# Patient Record
Sex: Female | Born: 1945 | Race: White | Hispanic: No | Marital: Single
Health system: Midwestern US, Community
[De-identification: ages and names within clinical notes are randomized; demographics above are authoritative.]

---

## 2013-12-09 IMAGING — MG MAMMOGRAPHY SCREENING BILATERAL 3D TOMOSYNTHESIS WITH CAD
12 of 16 series · 12 of 32 positions shown · non-contrast
Comparison: 05/11/2011 through 07/18/2007.
BREAST DENSITY: (Level B) There are scattered areas of fibroglandular density.

MAMMOGRAPHY SCREENING 3D TOMOSYNTHESIS WITH CAD, 12/09/2013 [DATE]:
HISTORY: Screening.
TECHNIQUE: Digital mammograms and 3-D Tomosynthesis were obtained. These were
interpreted both primarily and with the aid of computer-aided detection
system.

[R MLO]
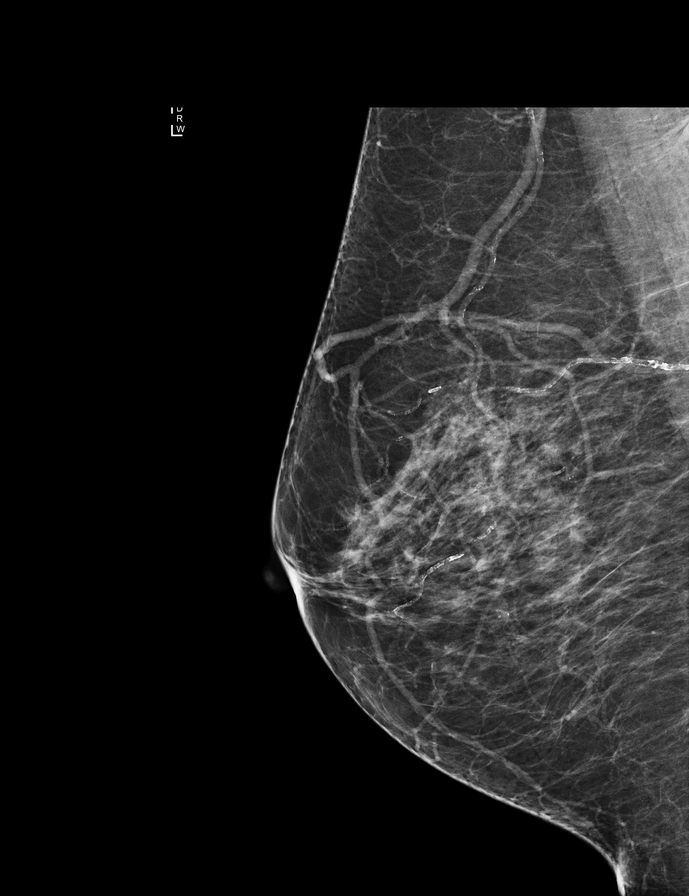

[L MLO]
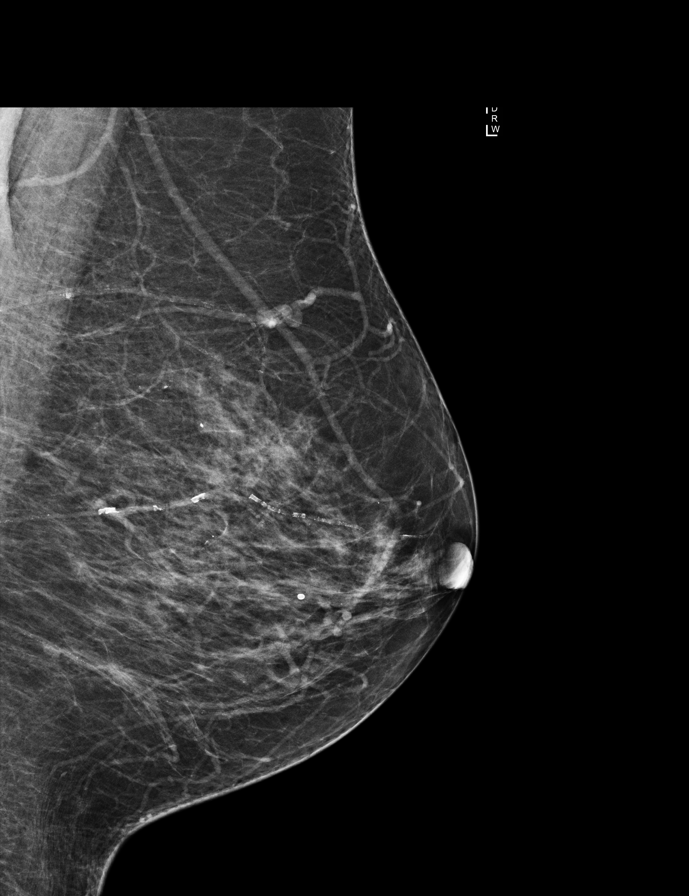

[R MLO synth-2D]
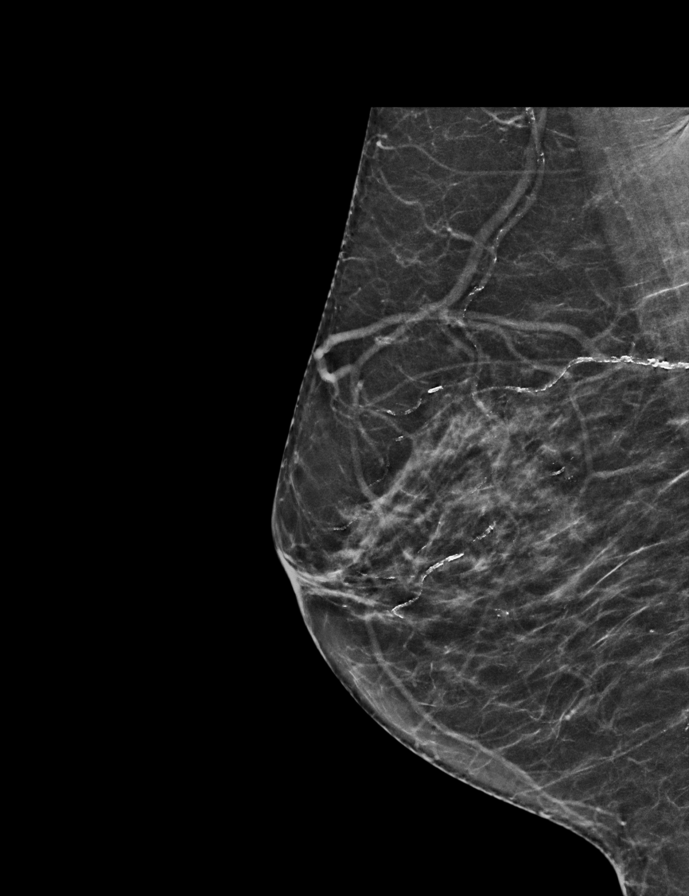

[L CC synth-2D]
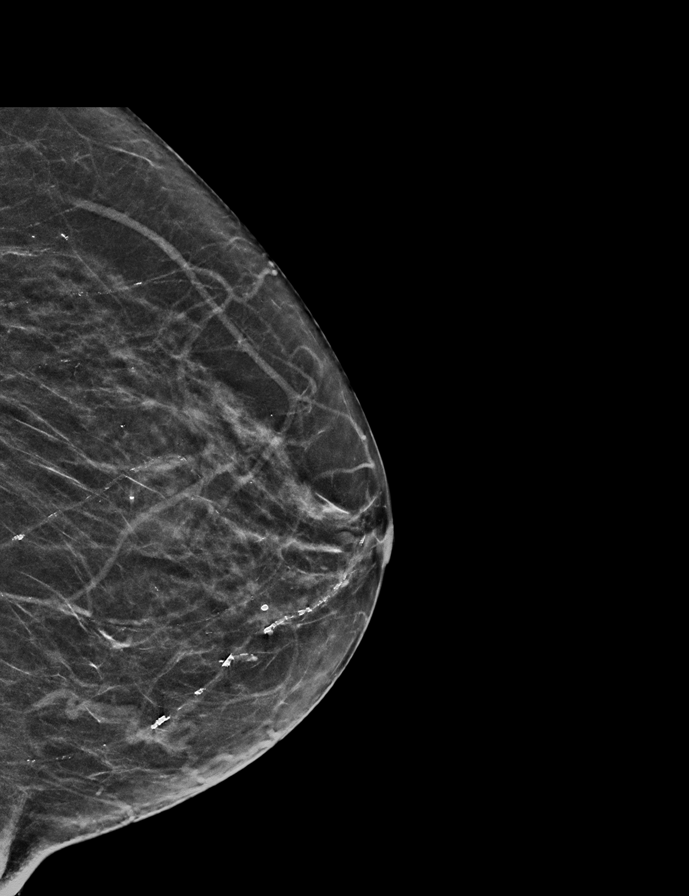

[L CC]
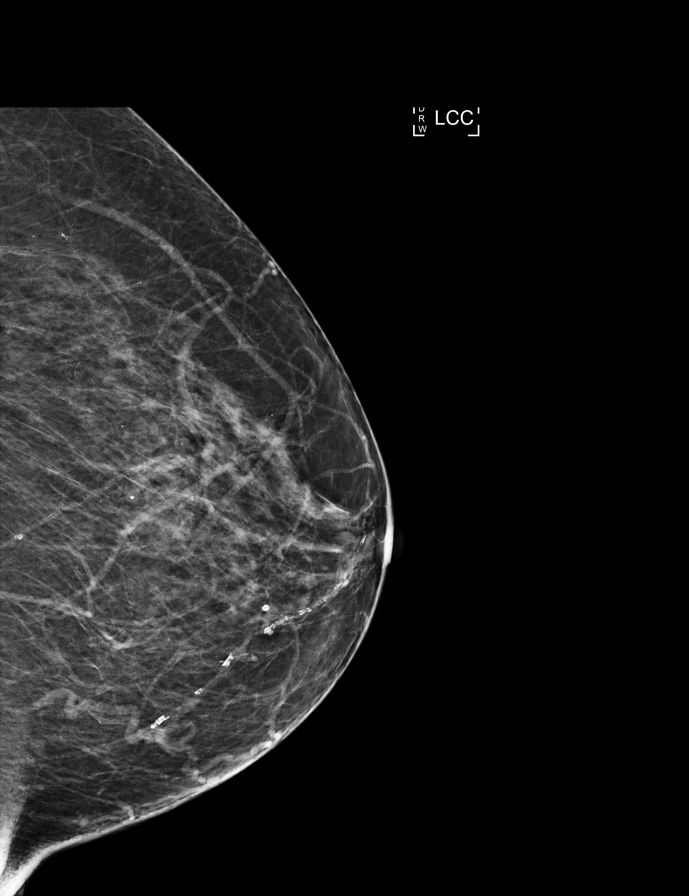

[R CC]
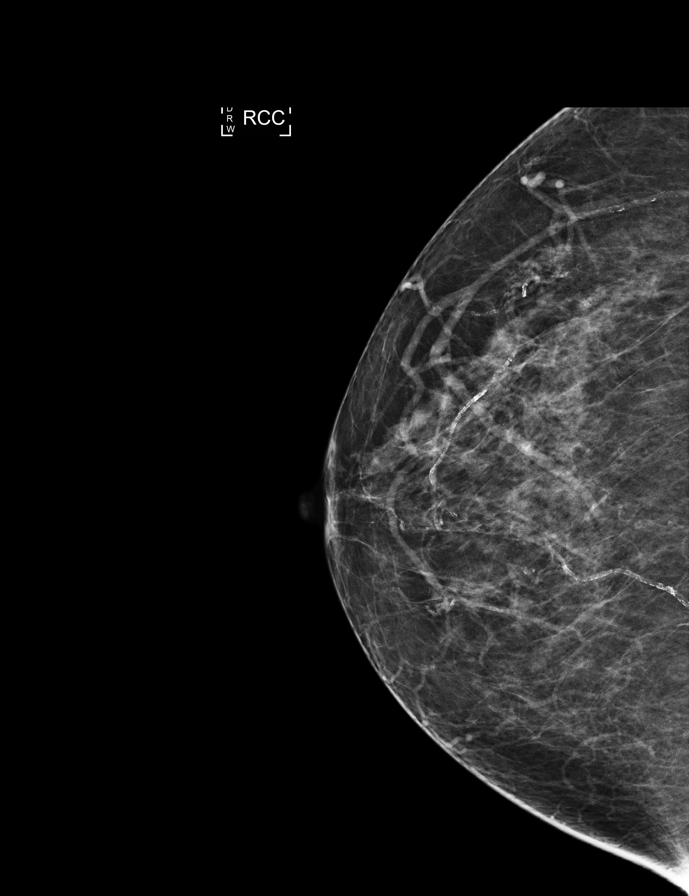

[L MLO synth-2D]
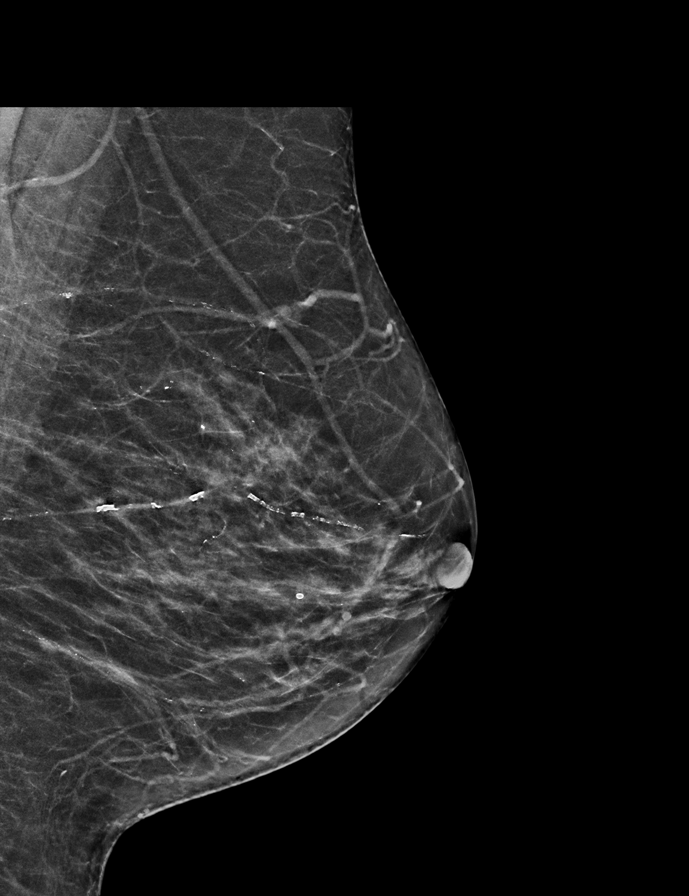

[R CC synth-2D]
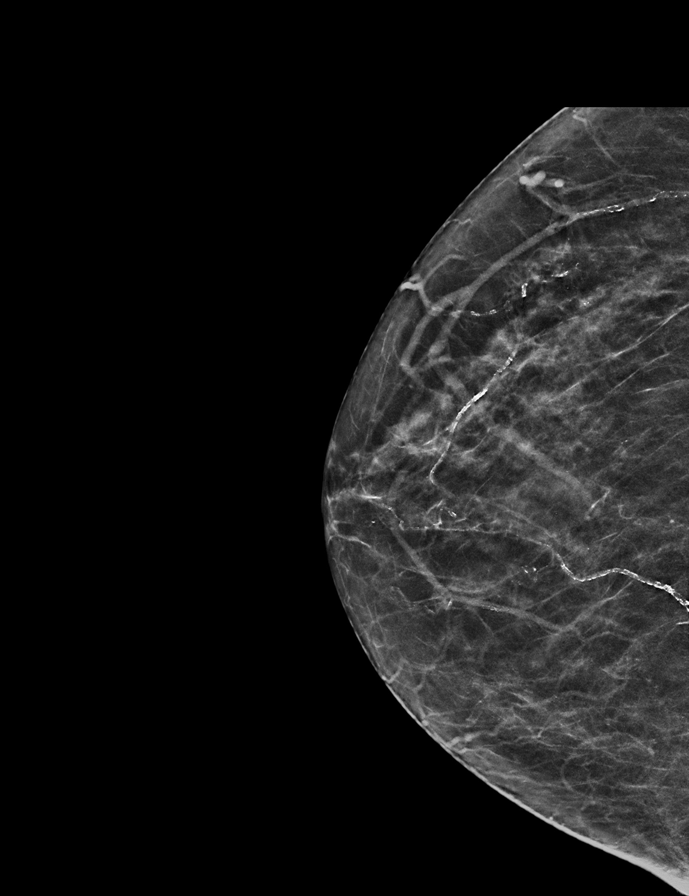

[L CC tomo]
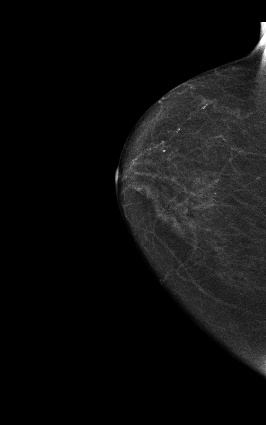

[R CC tomo]
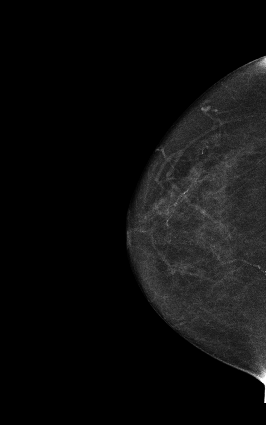

[L MLO tomo]
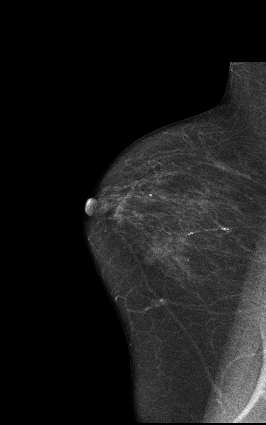

[R MLO tomo]
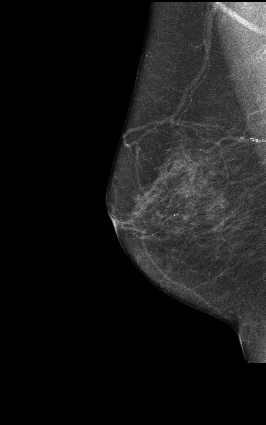

[12 of 32 positions shown; findings below may reference images not displayed]

FINDINGS: No mammographically suspicious abnormality and no significant
change.
IMPRESSION: ( BI-RADS 2) Benign findings. Routine mammographic follow-up is recommended.

## 2016-08-22 ENCOUNTER — Inpatient Hospital Stay
Admit: 2016-08-22 | Discharge: 2016-08-22 | Disposition: A | Discharge status: Home / Self Care | Payer: MEDICARE | Attending: Emergency Medicine

## 2016-08-22 ENCOUNTER — Emergency Department: Admit: 2016-08-22 | Payer: MEDICARE

## 2016-08-22 DIAGNOSIS — D1800 Hemangioma unspecified site: Secondary | ICD-10-CM

## 2016-08-22 LAB — CBC WITH AUTO DIFFERENTIAL
Absolute Eos #: 0.18 10*3/uL (ref 0.0–0.4)
Absolute Lymph #: 2.64 10*3/uL (ref 1.0–4.8)
Absolute Mono #: 0.35 10*3/uL (ref 0.1–0.8)
Basophils Absolute: 0 10*3/uL (ref 0.0–0.2)
Basophils: 0 % (ref 0–2)
Eosinophils %: 2 % (ref 1–4)
Hematocrit: 37.5 % (ref 36–46)
Hemoglobin: 12.1 g/dL (ref 12.0–16.0)
Lymphocytes: 30 % (ref 24–44)
MCH: 30.5 pg (ref 26–34)
MCHC: 32.2 g/dL (ref 31–37)
MCV: 94.8 fL (ref 80–100)
MPV: 7.4 fL (ref 6.0–12.0)
Metamyelocytes Absolute: 0.26 10*3/uL — ABNORMAL HIGH
Metamyelocytes: 3 % — ABNORMAL HIGH
Monocytes: 4 % (ref 1–7)
Myelocytes Absolute: 0.09 10*3/uL — ABNORMAL HIGH
Myelocytes: 1 % — ABNORMAL HIGH
Platelets: 461 10*3/uL — ABNORMAL HIGH (ref 140–450)
RBC: 3.96 m/uL — ABNORMAL LOW (ref 4.0–5.2)
RDW: 14.6 % (ref 12.5–15.4)
Seg Neutrophils: 60 % (ref 36–66)
Segs Absolute: 5.28 10*3/uL (ref 1.8–7.7)
WBC: 8.8 10*3/uL (ref 3.5–11.0)

## 2016-08-22 LAB — BASIC METABOLIC PANEL
Anion Gap: 12 mmol/L (ref 9–17)
BUN: 10 mg/dL (ref 8–23)
CO2: 28 mmol/L (ref 20–31)
Calcium: 9.4 mg/dL (ref 8.6–10.4)
Chloride: 98 mmol/L (ref 98–107)
Creatinine: 0.7 mg/dL (ref 0.50–0.90)
GFR African American: >60 mL/min (ref >60)
GFR Non-African American: >60 mL/min (ref >60)
Glucose: 121 mg/dL — ABNORMAL HIGH (ref 70–99)
Potassium: 4.8 mmol/L (ref 3.7–5.3)
Sodium: 138 mmol/L (ref 135–144)

## 2016-08-22 MED ORDER — NORMAL SALINE FLUSH 0.9 % IV SOLN
0.9 % | INTRAVENOUS | Status: DC | PRN
Start: 2016-08-22 — End: 2016-08-22
  Administered 2016-08-22: 20:00:00 10 mL via INTRAVENOUS

## 2016-08-22 MED ORDER — SODIUM CHLORIDE 0.9 % IV BOLUS
0.9 % | Freq: Once | INTRAVENOUS | Status: AC
Start: 2016-08-22 — End: 2016-08-22
  Administered 2016-08-22: 20:00:00 70 mL via INTRAVENOUS

## 2016-08-22 MED ORDER — IOPAMIDOL 76 % IV SOLN
76 % | Freq: Once | INTRAVENOUS | Status: AC | PRN
Start: 2016-08-22 — End: 2016-08-22
  Administered 2016-08-22: 20:00:00 70 mL via INTRAVENOUS

## 2016-08-22 NOTE — Progress Notes (Signed)
Linda Tate called PCP. Patient was directed to follow up with PCP.  Patient understood.

## 2016-08-22 NOTE — ED Notes (Addendum)
Dr. Lovena Le paged oncology on call at [facility] awaiting call back       Fonnie Birkenhead, RN  08/22/16 [place], RN  08/22/16 [phone]

## 2016-08-22 NOTE — ED Notes (Signed)
Called [facility] access center called for update. They have paged twice and awaiting a call back.     Gertie Fey, RN  08/22/16 1758

## 2016-08-22 NOTE — ED Notes (Signed)
Dr. Lovena Le spoke with oncology on call for [facility] and they suggest patient f/u w/neurosurgery. Dr. Lovena Le to bedside to update patient and her family.      Fonnie Birkenhead, RN  08/22/16 [phone]

## 2016-08-22 NOTE — ED Provider Notes (Signed)
[facility] ED  [place] [address]  Phone: [phone]      Pt Name: Linda Tate  MRN: [id number]  [place] 1946/04/07  Date of evaluation: 08/22/2016      CHIEF COMPLAINT       Chief Complaint   Patient presents with   ??? Facial Injury     [place] today at 1015 when she tripped over a rug and hit the corner of a wall in her home on the left side of her face. Denies LOC, vision changes, nausea, vomiting, or dizziness. Not taking any blood thinners. Took two 81 mg aspirin for pain after falling. Swelling, bruising to left eyelid, abrasion to left eyebrow area. Abrasions to left cheek.    ??? Neck Pain     C/o anterior and posterior neck pain s/p fall.    ??? Fall   ??? Knee Pain     right knee pain s/p fall       HISTORY OF PRESENT ILLNESS    71 year old female presents to the emergency department today after she sustained a mechanical fall.  She struck the left side of her face as well as the left side of her neck.  She also does complain of right knee pain from the fall.  She denies any loss of conscious.  She denies any nausea vomiting.  No otorrhea or rhinorrhea.  She is complaining of facial pain where she struck the wall as well as neck pain.  She denies any distal deficits.  Nothing makes it better and nothing makes it worse.  Pain on scale of 0-10 is a 6.  There's been no other contact [place] evaluation or management of these symptoms right arrival.    REVIEW OF SYSTEMS     Review of Systems   All other systems reviewed and are negative.        PAST MEDICAL HISTORY    has a past medical history of Anxiety; Arthritis; Depression; Hypertension; Right wrist fracture; and Thyroid disease.    SURGICAL HISTORY      has a past surgical history that includes Appendectomy; partial hysterectomy (cervix not removed); Facial reconstruction surgery; Toe Surgery (Right); Carpal tunnel release; Wrist fracture surgery (Right); joint replacement; Total knee arthroplasty (Right); and shoulder surgery  (Right).    CURRENT MEDICATIONS       Previous Medications    ALPRAZOLAM (XANAX) 1 MG TABLET    Take 1 mg by mouth 3 times daily.    ARIPIPRAZOLE (ABILIFY) 5 MG TABLET    Take 2.5 mg by mouth daily    CALCIUM-MAGNESIUM-VITAMIN D (CALCIUM 1200+D3 PO)    Take by mouth    CELECOXIB (CELEBREX) 200 MG CAPSULE    Take 200 mg by mouth daily    CYCLOBENZAPRINE (FLEXERIL) 5 MG TABLET    Take 5 mg by mouth daily as needed for Muscle spasms    CYCLOSPORINE (RESTASIS) 0.05 % OPHTHALMIC EMULSION    1 drop 2 times daily    ETANERCEPT (ENBREL) 50 MG/ML INJECTION    Inject 25 mg into the skin once a week    FOLIC ACID (FOLVITE) [id number] MCG TABLET    Take 1,200 mcg by mouth daily    HYDROCODONE-ACETAMINOPHEN (NORCO) 10-325 MG PER TABLET    Take 1 tablet by mouth every 6 hours as needed for Pain.    LEVOTHYROXINE (SYNTHROID) 88 MCG TABLET    Take 88 mcg by mouth Daily    LOSARTAN (COZAAR) 50 MG TABLET  Take 50 mg by mouth daily    MAGNESIUM 500 MG TABS    Take 1 tablet by mouth daily    METHOTREXATE (RHEUMATREX) 2.5 MG CHEMO TABLET    Take 20 mg by mouth once a week    MULTIPLE VITAMINS-MINERALS (MULTIVITAMIN ADULT PO)    Take by mouth    OMEPRAZOLE (PRILOSEC) 40 MG DELAYED RELEASE CAPSULE    Take 40 mg by mouth daily    SPECIALTY VITAMINS PRODUCTS (ONE-A-DAY BONE STRENGTH PO)    Take by mouth    TRAZODONE (DESYREL) 50 MG TABLET    Take 50 mg by mouth nightly    VITAMIN C (ASCORBIC ACID) 500 MG TABLET    Take 500 mg by mouth daily       ALLERGIES     is allergic to nickel.    FAMILY HISTORY     has no family status information on file.      family history is not on file.    SOCIAL HISTORY      reports that she has never smoked. She has never used smokeless tobacco. She reports that she does not drink alcohol or use drugs.    PHYSICAL EXAM     INITIAL VITALS:  height is 5\' 1"  (1.549 m) and weight is 68.9 kg (152 lb). Her oral temperature is 97.5 ??F (36.4 ??C). Her blood pressure is 160/90 (abnormal) and her pulse is 121. Her respiration  is 16 and oxygen saturation is 95%.   Physical Exam   Constitutional: She is oriented to person, place, and time. She appears well-developed and well-nourished. No distress.   HENT:   Head: Normocephalic.   Mouth/Throat: Oropharynx is clear and moist.   Atraumatic except for ecchymosis lateral to this patient's left orbit.  No midface instability.  No bony crepitus.  No subcutaneous crepitus.   Eyes: Conjunctivae and EOM are normal. Pupils are equal, round, and reactive to light.   Neck: No tracheal deviation present.   Patient has some posterior neck tenderness to palpation.  No paraspinal tenderness to palpation.  No midline crepitus or step-offs.  Patient has been placed in cervical collar.  There is a mass in this patient's left anterior neck.  The submandibular area.  It is nontender and freely movable.  It is quite large.   Cardiovascular: Normal rate, regular rhythm and intact distal pulses.    Pulmonary/Chest: Effort normal and breath sounds normal. No respiratory distress.   Abdominal: Soft. Bowel sounds are normal. She exhibits no distension. There is no tenderness.   Musculoskeletal: Normal range of motion. She exhibits no edema or tenderness.   Neurological: She is alert and oriented to person, place, and time. No cranial nerve deficit. She exhibits normal muscle tone.   Skin: Skin is warm and dry.   Psychiatric: She has a normal mood and affect. Her behavior is normal. Judgment and thought content normal.   Vitals reviewed.        DIFFERENTIAL DIAGNOSIS/ MDM:   I've ordered a CAT scan of her head and cervical spine.  Of ordered an x-ray of her right knee to address the issue of trauma.    DIAGNOSTIC RESULTS     EKG:  None    RADIOLOGY:   Xr Knee Right (3 Views)    Result Date: 08/22/2016  EXAMINATION: 3 VIEWS OF THE RIGHT KNEE 08/22/2016 1:47 pm COMPARISON: None. HISTORY: ORDERING SYSTEM PROVIDED HISTORY: knee pain TECHNOLOGIST PROVIDED HISTORY: Reason for exam:->knee pain Ordering Physician Provided  Reason for Exam: s/p fall, knee pain Acuity: Acute Type of Exam: Initial FINDINGS: There is a well-seated right knee arthroplasty.  No periprosthetic lucency is identified.  There is no suprapatellar joint effusion.     Right knee arthroplasty appears well seated.  No fracture or dislocation is identified.     Ct Head Wo Contrast    Result Date: 08/22/2016  EXAMINATION: CT OF THE HEAD WITHOUT CONTRAST  08/22/2016 2:07 pm TECHNIQUE: CT of the head was performed without the administration of intravenous contrast. Dose modulation, iterative reconstruction, and/or weight based adjustment of the mA/kV was utilized to reduce the radiation dose to as low as reasonably achievable. COMPARISON: None HISTORY: ORDERING SYSTEM PROVIDED HISTORY: HEAD TRAUMA, CLOSED, MILD, GCS >= 13, NO RISK FACTORS, NEURO EXAM NORMAL TECHNOLOGIST PROVIDED HISTORY: Has a "code stroke" or "stroke alert" been called?->No Ordering Physician Provided Reason for Exam: S/p fall into wall, hit left side of face Acuity: Acute Type of Exam: Initial FINDINGS: BRAIN/VENTRICLES: Patchy deep white matter low-attenuation changes predominately in the occipital lobes are demonstrated, these are statistically consistent with small vessel disease for patient's age; however, possibility of posterior reversible encephalopathy syndrome "PRES" cannot be entirely excluded.  There is no acute intracranial hemorrhage, mass effect or midline shift.  No abnormal extra-axial fluid collection.  The gray-white differentiation is maintained without evidence of an acute infarct.  There is no evidence of hydrocephalus. ORBITS: The visualized portion of the orbits demonstrate no acute abnormality. SINUSES: The visualized paranasal sinuses and mastoid air cells demonstrate no acute abnormality. SOFT TISSUES/SKULL:  No acute abnormality of the visualized skull or soft tissues.  Mandible and adjacent osseous structures cannot be evaluated due to artifacts emanating from dental  hardware.     Patchy deep white matter low-attenuation changes predominately in the occipital lobe likely a sequela of small vessel disease; however, less likely possibility of posterior reversible encephalopathy syndrome 'PRES' cannot be entirely excluded. No evidence of acute intracranial injury.     Ct Soft Tissue Neck W Contrast    Result Date: 08/22/2016  EXAMINATION: CT OF THE NECK SOFT TISSUE WITH CONTRAST  08/22/2016 TECHNIQUE: CT of the neck was performed with the administration of intravenous contrast. Multiplanar reformatted images are provided for review. Dose modulation, iterative reconstruction, and/or weight based adjustment of the mA/kV was utilized to reduce the radiation dose to as low as reasonably achievable. COMPARISON: None. HISTORY: ORDERING SYSTEM PROVIDED HISTORY: MASS OR LUMP, NECK TECHNOLOGIST PROVIDED HISTORY: Ordering Physician Provided Reason for Exam: S/P fall into wall, CT cspine suggests CT Soft Tissue suspects mass vs hematoma.BB on area of interest Acuity: Acute Type of Exam: Initial FINDINGS: PHARYNX/LARYNX:  Nasopharynx is normal.  Uvula is not well seen.  Epiglottis is normal.  Piriform sinuses are normal.  Detail of the vocal cords is limited. SALIVARY GLANDS/THYROID:  No parotid, thyroid or submandibular gland lesion is noted. LYMPH NODES:  Small cervical lymph nodes are present bilaterally.  The largest is in the lower cervical region on the left measuring approximately 1.4 x 0.9 cm. SOFT TISSUES:  There is a large enhancing mass lesion, likely enhancing mass lesion, centered at the level of the left carotid artery bifurcation.  This measures approximately 3.8 x 2.2 x 3 cm.  There is mass-effect on the jugular vein and carotid arteries.  There are numerous adjacent vessels in the soft tissues.  Small collateral vessels extend up to the jugular foramen as well. BRAIN/ORBITS/SINUSES:  The visualized portion of the intracranial contents appear  unremarkable.  The visualized portion  of the orbits, paranasal sinuses and mastoid air cells demonstrate no acute abnormality. LUNG APICES/SUPERIOR MEDIASTINUM:  0 enlarged lymph nodes in the upper mediastinum are noted.  No apical lung nodules are noted.  Apical pleural thickening is noted bilaterally. BONES:  No aggressive lytic bone lesions are noted.     There is a large enhancing mass lesion centered at the level of the carotid artery bifurcation.  This is predominantly along the posterior aspect of the bifurcation with mass-effect/displacement of the carotid arteries anteriorly and posterior displacement and compression of the jugular vein.  There are numerous adjacent vessels in the soft tissues raising the question of collateral vessels or arteriovenous shunting.  There is extension of these vessels up to the jugular foramen at the skullbase level.  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  The tumor does not appear to significantly surround the carotid artery.  There may be some extension between the carotid arteries.  There are numerous adjacent collateral vessels perhaps related to early arteriovenous shunting.  A less likely consideration for this tumor would be malignant schwannoma.     Ct Cervical Spine Wo Contrast    Result Date: 08/22/2016  EXAMINATION: CT OF THE CERVICAL SPINE WITHOUT CONTRAST 08/22/2016 2:09 pm TECHNIQUE: CT of the cervical spine was performed without the administration of intravenous contrast. Multiplanar reformatted images are provided for review. Dose modulation, iterative reconstruction, and/or weight based adjustment of the mA/kV was utilized to reduce the radiation dose to as low as reasonably achievable. COMPARISON: None. HISTORY: ORDERING SYSTEM PROVIDED HISTORY: NECK PAIN FOLLOWING TRAUMA TECHNOLOGIST PROVIDED HISTORY: Ordering Physician Provided Reason for Exam: S/p fall into wall, hit left side of face Acuity: Acute Type of Exam: Initial FINDINGS: BONES/ALIGNMENT: There is loss of the  normal cervical spine lordosis.  Facets overlap bilaterally at every level.  No acute fracture in the cervical spine is noted. DEGENERATIVE CHANGES: Degenerative changes throughout the cervical spine are noted. SOFT TISSUES: Prevertebral soft tissue thickness is normal. Incidentally, there is a large amount of asymmetric soft tissue along the deep aspect of the left sternocleidomastoid muscle.  This is seen best on axial images 29 through 42.  CT neck with contrast is recommended to exclude enlarged lymph node or focal mass lesion.  Another consideration is hematoma. There is also high density in the 4th ventricle.  Hounsfield units suggest that this is calcification.  Unusual calcification of the choroid plexus is favored over a calcified lesion.     No acute abnormality of the cervical spine. There is a suspected soft tissue mass or markedly enlarged lymph node in the left side of the neck.  A CT neck with contrast is recommended.  Another consideration, given the history of trauma, is hematoma.  However, There is no high density to suggest hemorrhage.         LABS:  Results for orders placed or performed during the hospital encounter of 08/22/16   CBC Auto Differential   Result Value Ref Range    WBC 8.8 3.5 - 11.0 k/uL    RBC 3.96 (L) 4.0 - 5.2 m/uL    Hemoglobin 12.1 12.0 - 16.0 g/dL    Hematocrit 37.5 36 - 46 %    MCV 94.8 80 - 100 fL    MCH 30.5 26 - 34 pg    MCHC 32.2 31 - 37 g/dL    RDW 14.6 12.5 - 15.4 %    Platelets  461 (H) 140 - 450 k/uL    MPV 7.4 6.0 - 12.0 fL    NRBC Automated NOT REPORTED per 100 WBC    Differential Type NOT REPORTED     Immature Granulocytes NOT REPORTED 0 %    Absolute Immature Granulocyte NOT REPORTED 0.00 - 0.30 k/uL    WBC Morphology NOT REPORTED     RBC Morphology NOT REPORTED     Platelet Estimate NOT REPORTED     Seg Neutrophils 60 36 - 66 %    Lymphocytes 30 24 - 44 %    Monocytes 4 1 - 7 %    Eosinophils % 2 1 - 4 %    Basophils 0 0 - 2 %    Metamyelocytes 3 (H) 0 %     Myelocytes 1 (H) 0 %    Segs Absolute 5.28 1.8 - 7.7 k/uL    Absolute Lymph # 2.64 1.0 - 4.8 k/uL    Absolute Mono # 0.35 0.1 - 0.8 k/uL    Absolute Eos # 0.18 0.0 - 0.4 k/uL    Basophils # 0.00 0.0 - 0.2 k/uL    Metamyelocytes Absolute 0.26 (H) 0 k/uL    Myelocytes Absolute 0.09 (H) 0 k/uL    Morphology LARGE PLATELETS PRESENT    Basic Metabolic Panel   Result Value Ref Range    Glucose 121 (H) 70 - 99 mg/dL    BUN 10 8 - 23 mg/dL    CREATININE 0.70 0.50 - 0.90 mg/dL    Bun/Cre Ratio NOT REPORTED 9 - 20    Calcium 9.4 8.6 - 10.4 mg/dL    Sodium 138 135 - 144 mmol/L    Potassium 4.8 3.7 - 5.3 mmol/L    Chloride 98 98 - 107 mmol/L    CO2 28 20 - 31 mmol/L    Anion Gap 12 9 - 17 mmol/L    GFR Non-African American >60 >60 mL/min    GFR African American >60 >60 mL/min    GFR Comment          GFR Staging NOT REPORTED        EMERGENCY DEPARTMENT COURSE:     The patient was given the following medications:  Orders Placed This Encounter   Medications   ??? iopamidol (ISOVUE-370) 76 % injection 70 mL   ??? 0.9 % sodium chloride bolus   ??? sodium chloride flush 0.9 % injection 10 mL        Vitals:    Vitals:    08/22/16 1337   BP: (!) 160/90   Pulse: 121   Resp: 16   Temp: 97.5 ??F (36.4 ??C)   TempSrc: Oral   SpO2: 95%   Weight: 68.9 kg (152 lb)   Height: 5\' 1"  (1.549 m)     -------------------------  BP: (!) 160/90, Temp: 97.5 ??F (36.4 ??C), Pulse: 121, Resp: 16      Re-evaluation Notes  With the initial CT result, I have ordered a CT with IV contrast to address the radiologist's concern.    I reviewed the results with the patient.  She tells me that this mass has been there for at least a couple years.  I do feel that she should certainly have close follow-up with the specialist.    I have discussed with the oncology the appropriate disposition for this patient.  He recommends neurosurgery.  I've written for consultation.  Patient will be discharged.    CONSULTS:  None  CRITICAL CARE:   None    PROCEDURES:  None    FINAL  IMPRESSION      1. Glomus tumor    2. Closed head injury, initial encounter    3. Acute strain of neck muscle, initial encounter    4. Acute pain of right knee    5. Facial contusion, initial encounter          DISPOSITION/PLAN   DISPOSITION Decision To Discharge 08/22/2016 06:02:30 PM      Condition on Disposition  Good    PATIENT REFERRED TO:  Annett Fabian, MD  [place] [address]  [phone]    Schedule an appointment as soon as possible for a visit in 2 days      Rhodia Albright, [place]  [place] [address] [place] [address]  [phone]    Schedule an appointment as soon as possible for a visit in 2 days        DISCHARGE MEDICATIONS:  New Prescriptions    No medications on file       (Please note that portions of this note were completed with a voice recognition program.  Efforts were made to edit the dictations but occasionally words are mis-transcribed.)    Manuela Neptune MD, F.A.C.E.P, F.A.A.E.M  Emergency Physician Attending         Manuela Neptune, MD  08/22/16 [phone]

## 2016-08-22 NOTE — Progress Notes (Signed)
PCP notified. Sending ED notes.

## 2016-08-28 NOTE — Telephone Encounter (Signed)
Patient called the clinic asking for the phone number for the [facility].  She was referred to Neurosurgery but Dr. Jola Babinski declined the referral.  SEE NOTE    "Message   Received: 6 days ago   Message Contents   Zubair [place], DO  [facility]; Harland German; Charlayne McClendon-Williams, MA      ??      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 I do not do these operations so no point in setting up the appt.      Harland German spoke with this patient's PCP. ED notes were sent her PCP.

## 2020-02-16 IMAGING — DX HAND 3 VIEWS LEFT
3 series · 3 of 3 positions shown · non-contrast
Comparison: None.

HAND 3 VIEWS RIGHT, HAND 3 VIEWS LEFT, 02/16/2020 [DATE]: 
CLINICAL INDICATION: History of rheumatoid arthritis. Hand pain.

[PA]
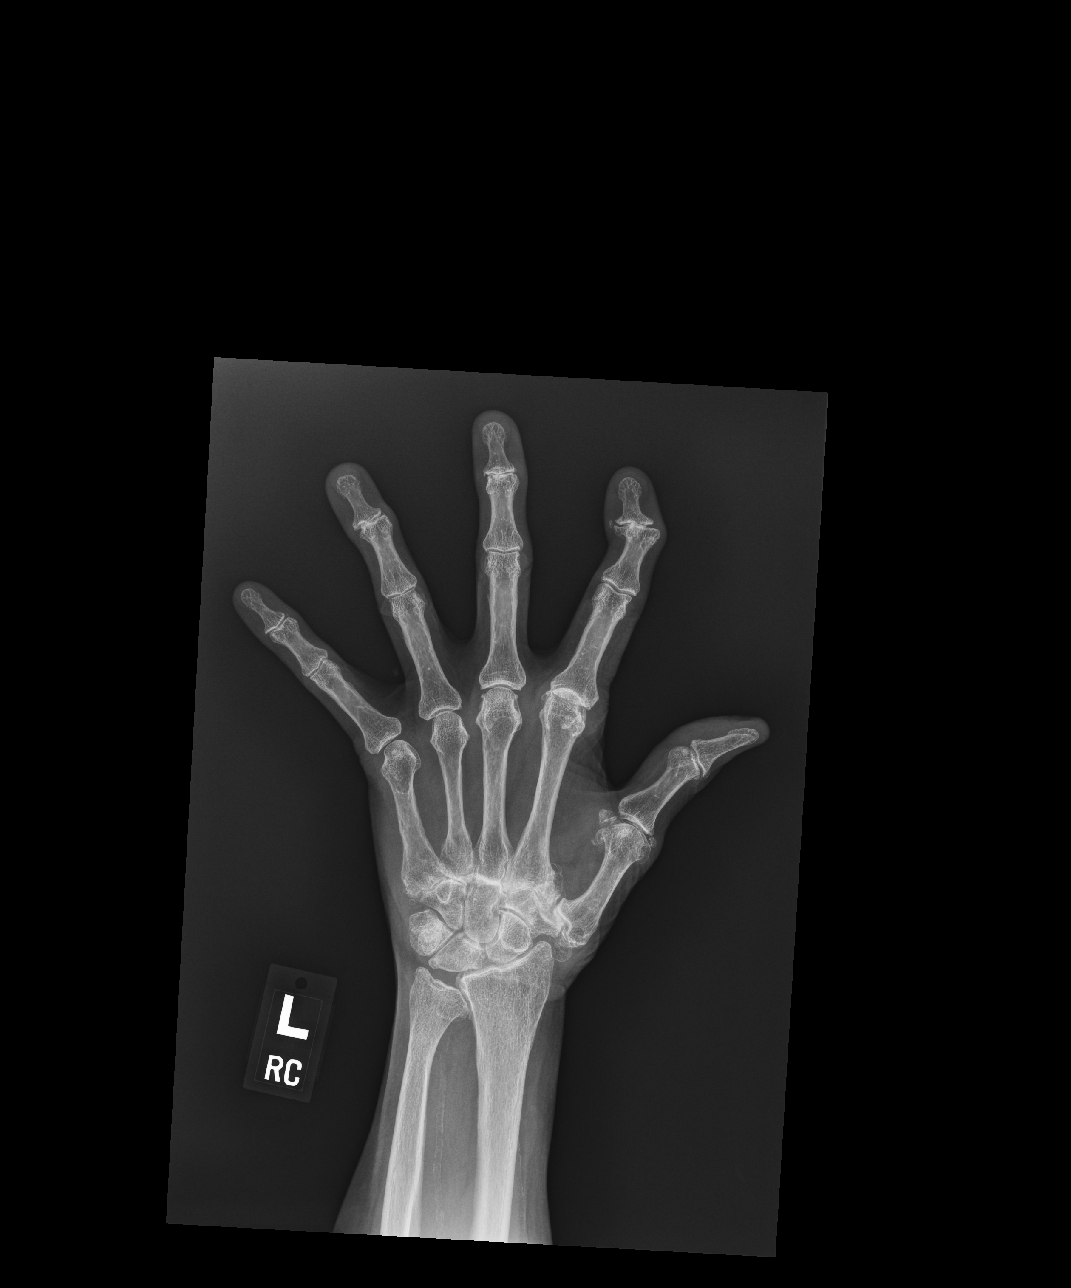

[oblique]
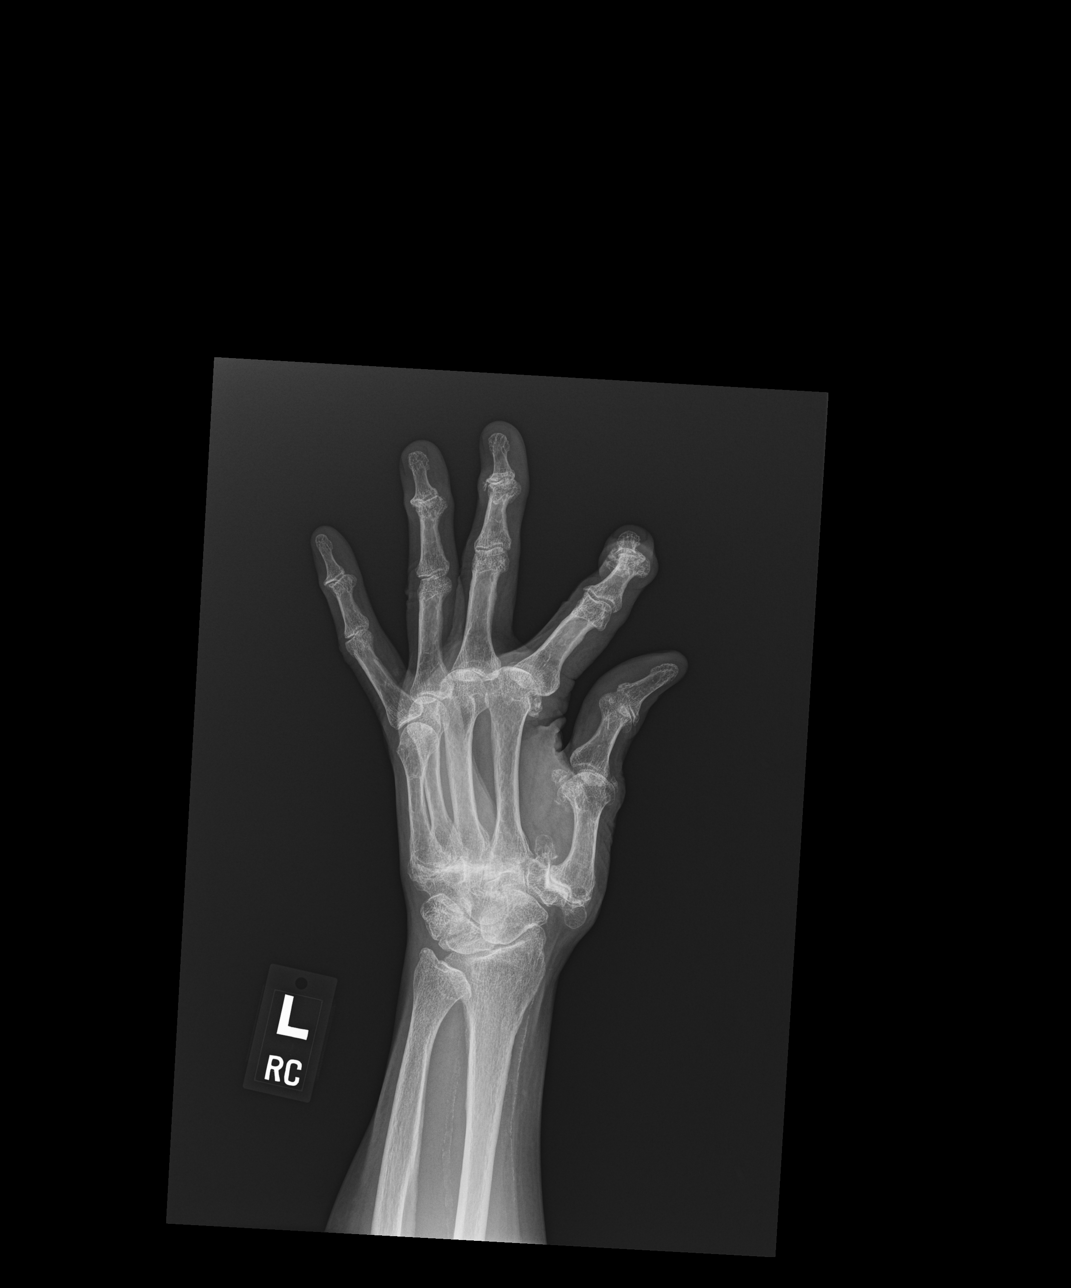

[lateral]
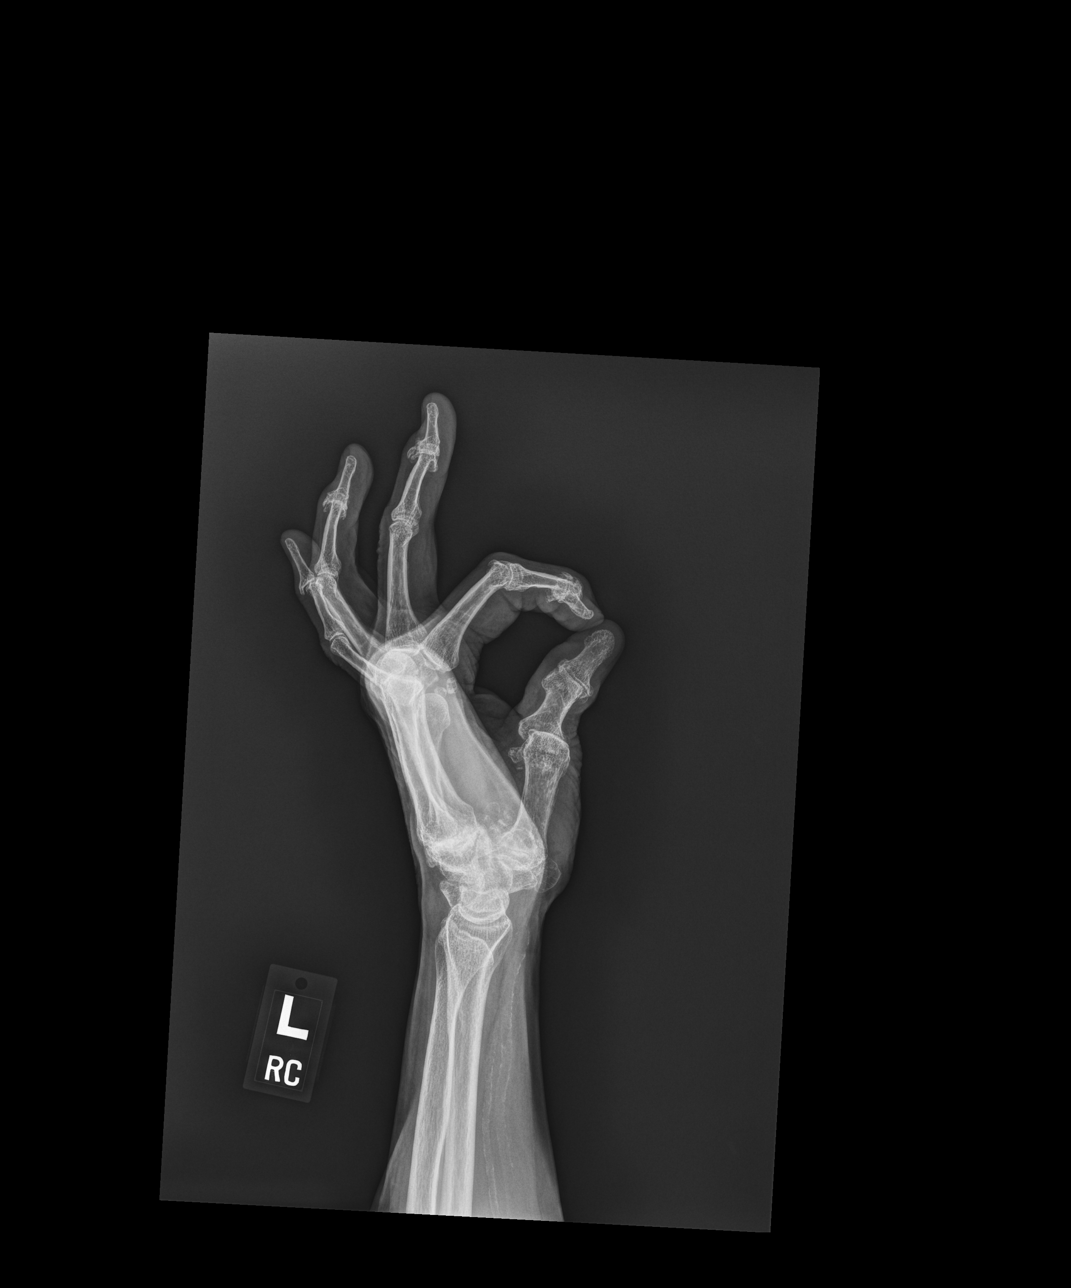

[3 of 3 positions shown; findings below may reference images not displayed]

FINDINGS: Advanced changes of rheumatoid arthritis, most marked involving all of 
the left carpometacarpal (CMC) joints. Marked left second, bilateral thumb and 
mild left third metacarpophalangeal (MCP) joint space narrowing. Marked 
degenerative change of the thumb carpometacarpal (VFV-5) joints. Moderate 
degenerative change of the distal radioulnar joints (DRUJs). Erosive 
osteoarthritis of several distal interphalangeal (DIP) joints, more marked on 
the left, with ulnar subluxation of the distal left index finger. Old right 
second mid metacarpal shaft fracture deformity and abutting right 2-3 metacarpal 
shaft well-corticated exostoses. Normal bone density. No soft tissue swelling.
IMPRESSION: Multifocal changes consistent with rheumatoid arthritis and degenerative change, 
more marked on the left.

## 2020-02-16 IMAGING — DX FOOT 3 VIEWS LEFT
3 series · 3 of 3 positions shown · non-contrast
Comparison: None.

FOOT 3 VIEWS LEFT, FOOT 3 VIEWS RIGHT, 02/16/2020 [DATE]: 
CLINICAL INDICATION:  Rheumatoid arthritis with rheumatoid factor. 
Polyosteoarthritis. Pain.

[PA]
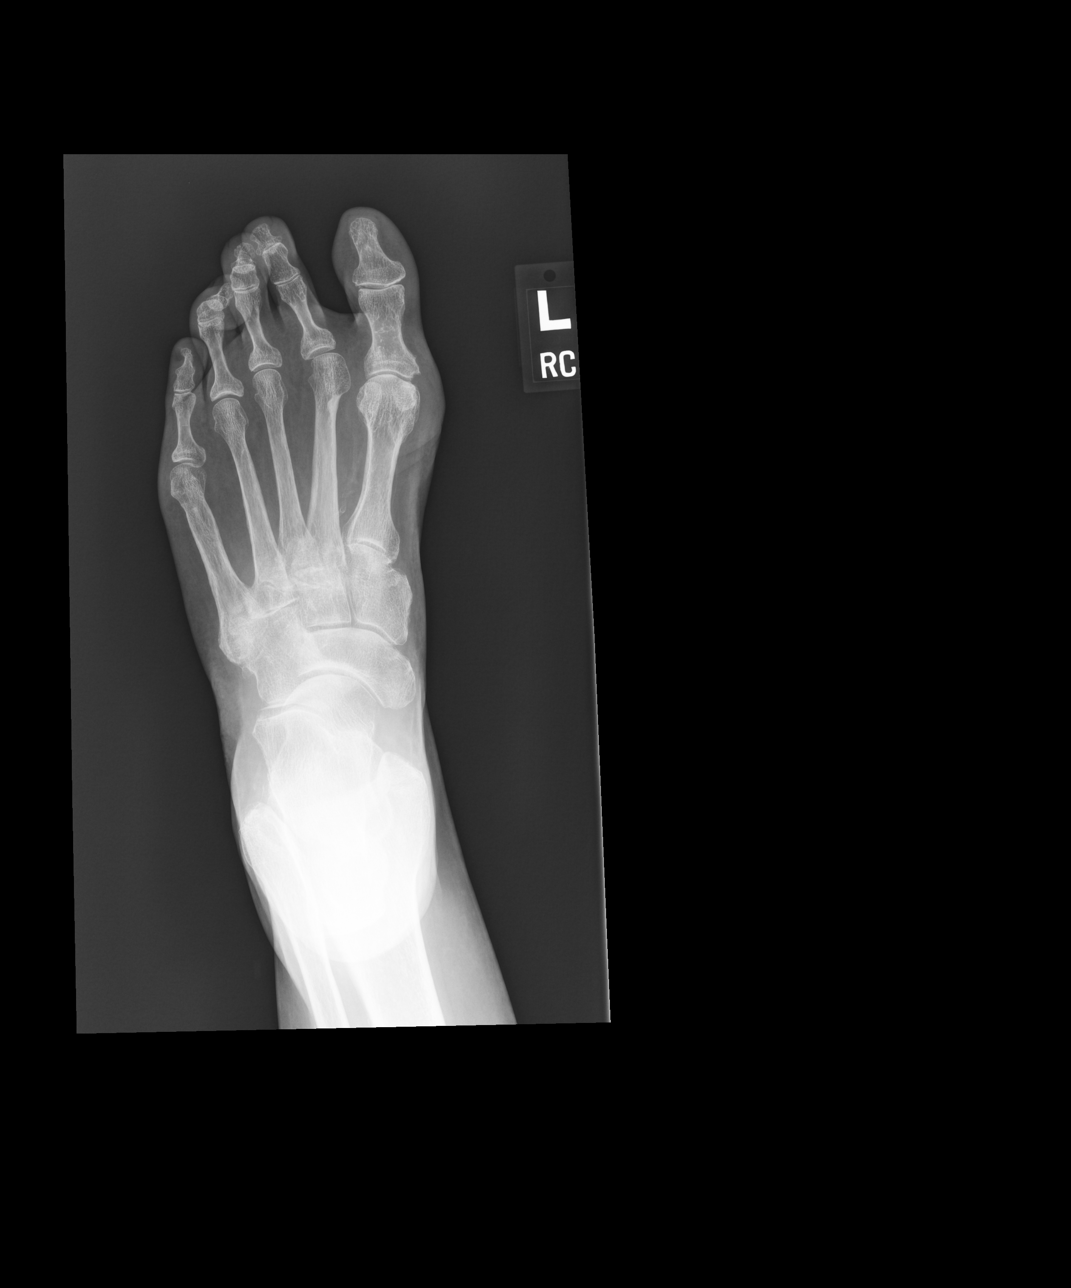

[oblique]
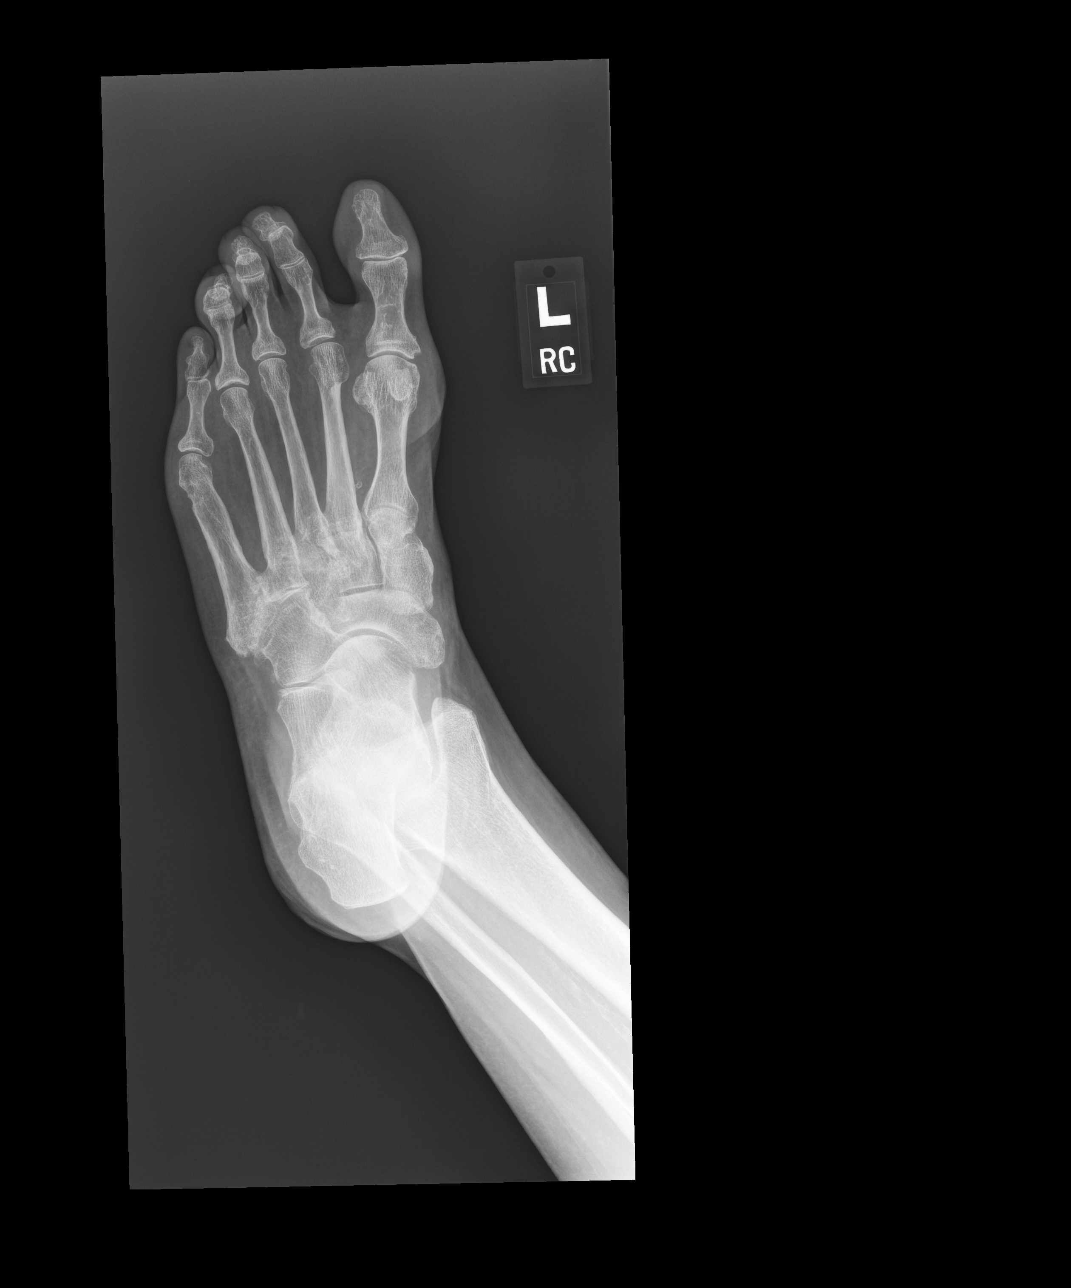

[lateral]
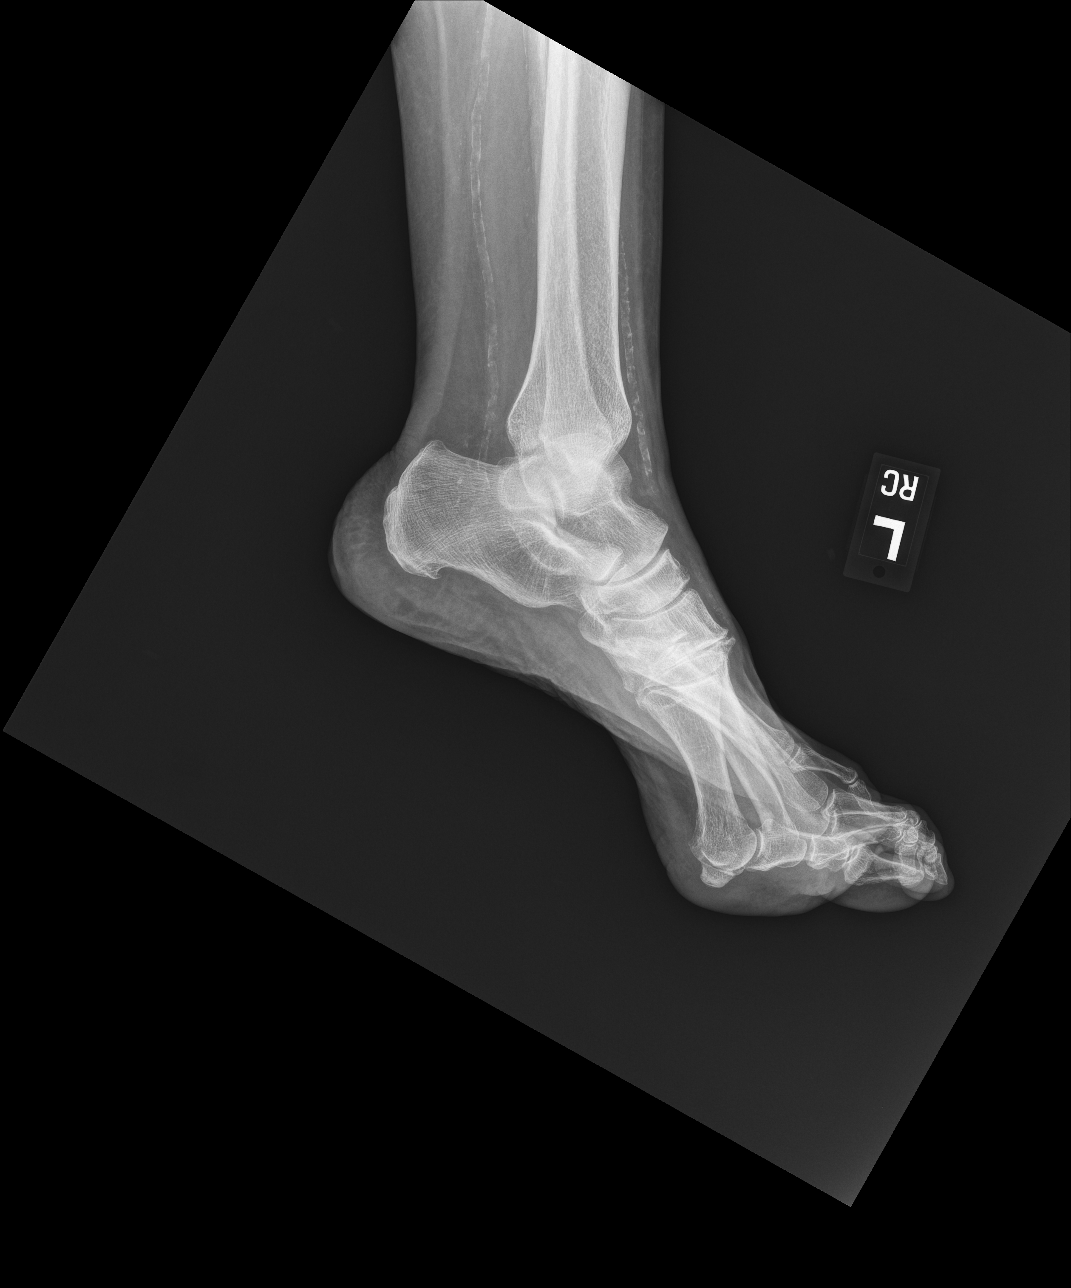

[3 of 3 positions shown; findings below may reference images not displayed]

FINDINGS: Advanced changes of rheumatoid arthritis of the right foot with marked 
joint space narrowing and erosive change of the 2-5 tarsometatarsal (TMT) joint 
and hallux metatarsophalangeal (MTP) joint. Second and fifth hammertoe 
deformities. Chronic, healed distal/mid fifth metatarsal fracture versus 
osteotomy with mid metatarsal screw. Small plantar calcaneal enthesophyte. Pes 
planovalgus on these nonweightbearing views. 
Erosive change of the left hallux proximal phalangeal base. Flexion deformities 
of the left third and fourth toes. Old left distal fifth metatarsal fracture 
deformity. Congenital fusion of the fifth distal interphalangeal (DIP) joint. 
Left distal second metatarsal shaft bone island. Osteopenia. No fracture. 
Atherosclerosis.
IMPRESSION: 1. Changes of rheumatoid arthritis, more marked on the right. 
2. Old fifth metatarsal fracture deformities/osteotomies.

## 2020-02-16 IMAGING — DX FOOT 3 VIEWS RIGHT
3 series · 3 of 3 positions shown · non-contrast
Comparison: None.

FOOT 3 VIEWS LEFT, FOOT 3 VIEWS RIGHT, 02/16/2020 [DATE]: 
CLINICAL INDICATION:  Rheumatoid arthritis with rheumatoid factor. 
Polyosteoarthritis. Pain.

[PA]
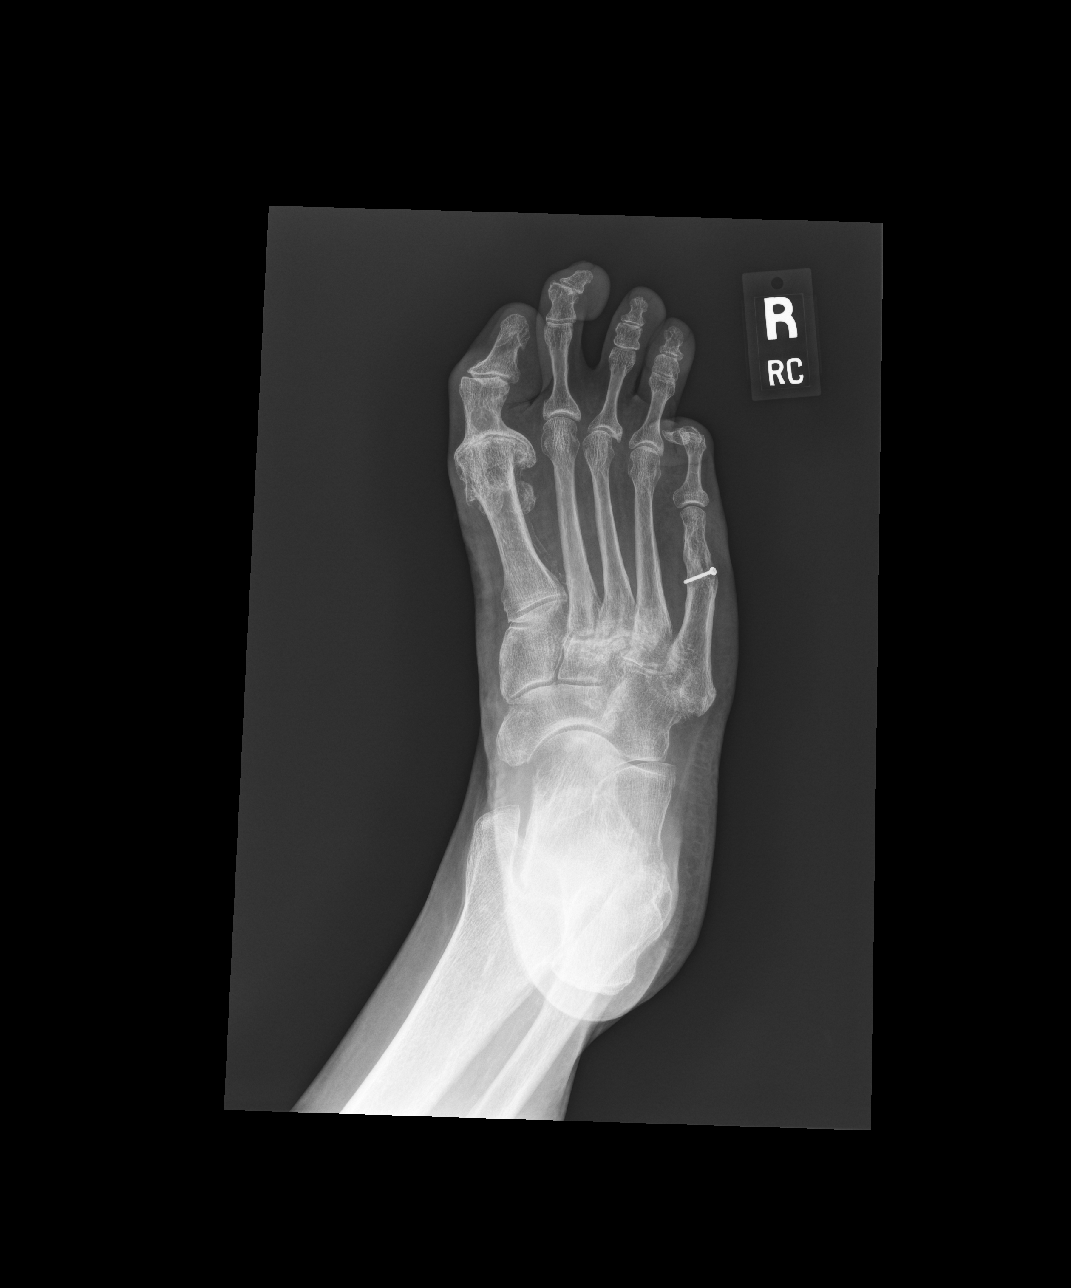

[oblique]
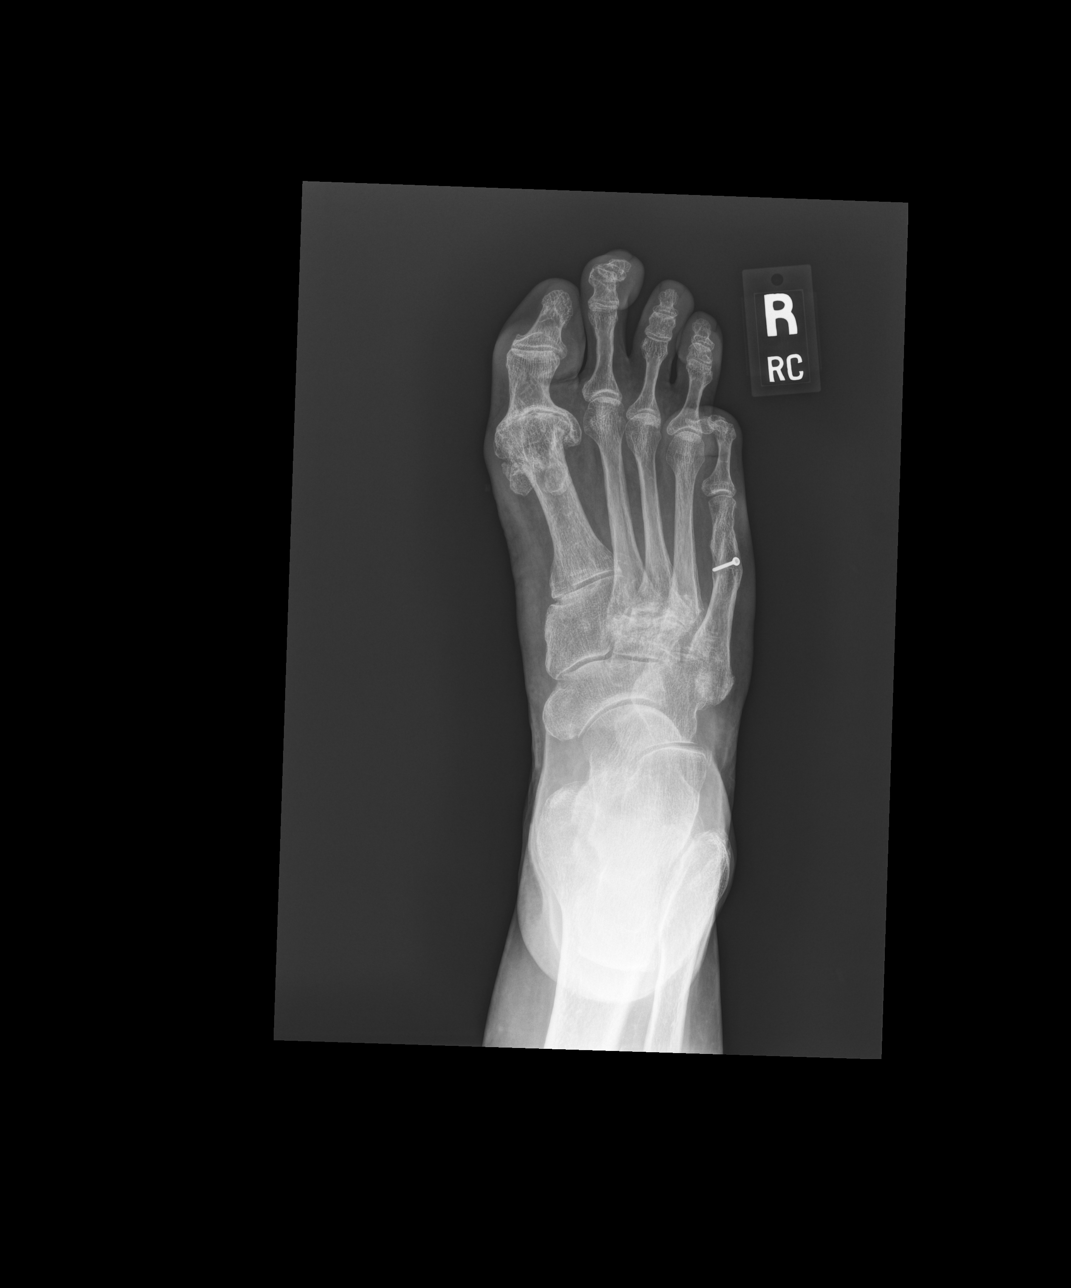

[lateral]
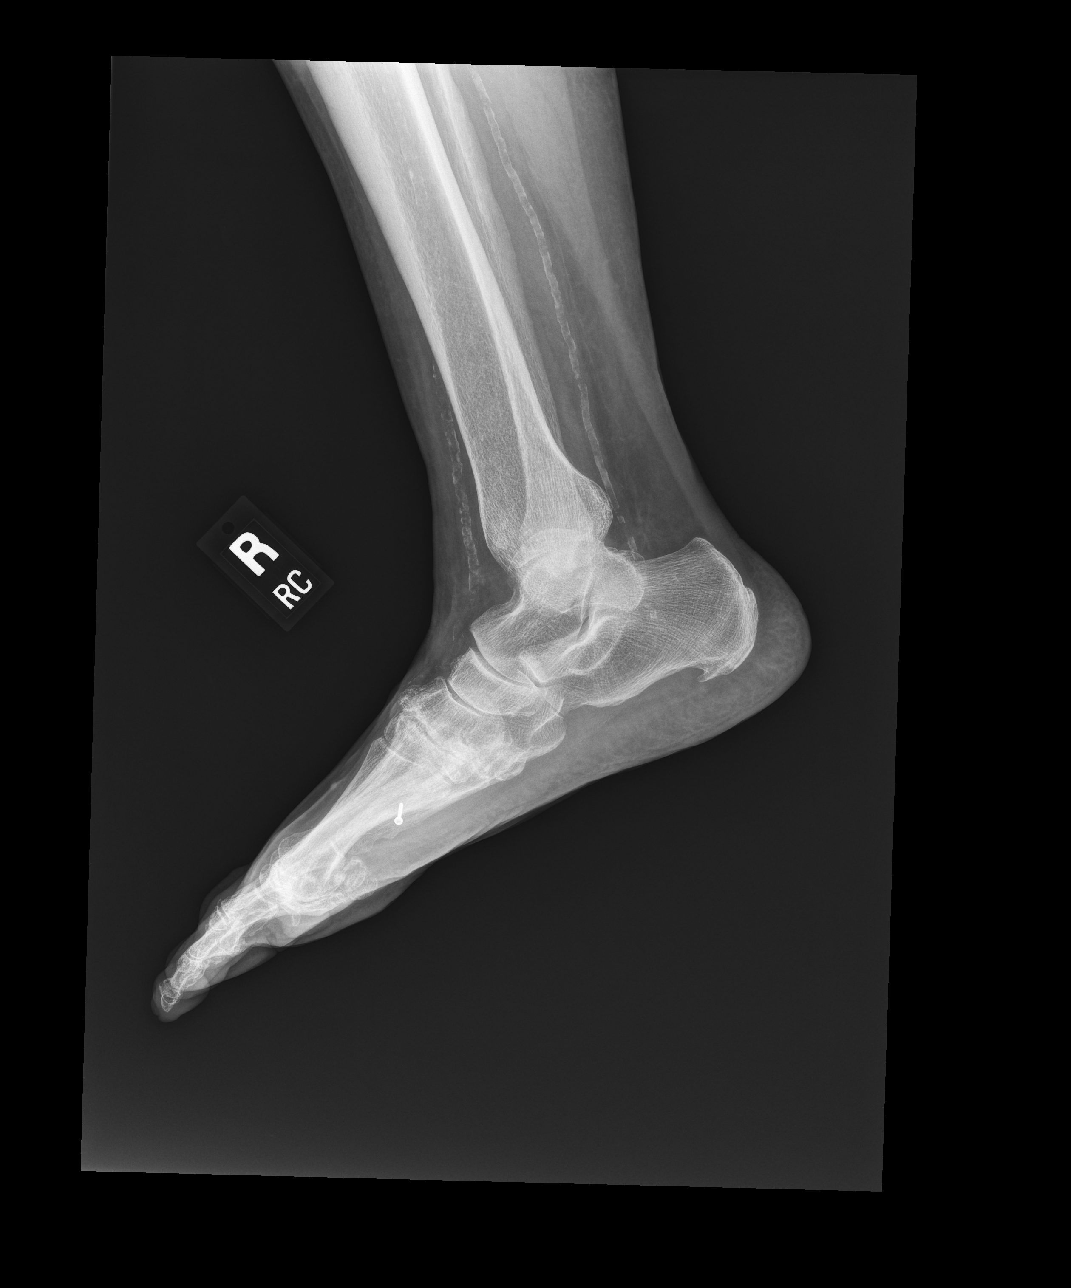

[3 of 3 positions shown; findings below may reference images not displayed]

FINDINGS: Advanced changes of rheumatoid arthritis of the right foot with marked 
joint space narrowing and erosive change of the 2-5 tarsometatarsal (TMT) joint 
and hallux metatarsophalangeal (MTP) joint. Second and fifth hammertoe 
deformities. Chronic, healed distal/mid fifth metatarsal fracture versus 
osteotomy with mid metatarsal screw. Small plantar calcaneal enthesophyte. Pes 
planovalgus on these nonweightbearing views. 
Erosive change of the left hallux proximal phalangeal base. Flexion deformities 
of the left third and fourth toes. Old left distal fifth metatarsal fracture 
deformity. Congenital fusion of the fifth distal interphalangeal (DIP) joint. 
Left distal second metatarsal shaft bone island. Osteopenia. No fracture. 
Atherosclerosis.
IMPRESSION: 1. Changes of rheumatoid arthritis, more marked on the right. 
2. Old fifth metatarsal fracture deformities/osteotomies.

## 2020-02-16 IMAGING — DX HAND 3 VIEWS RIGHT
3 series · 3 of 3 positions shown · non-contrast
Comparison: None.

HAND 3 VIEWS RIGHT, HAND 3 VIEWS LEFT, 02/16/2020 [DATE]: 
CLINICAL INDICATION: History of rheumatoid arthritis. Hand pain.

[PA]
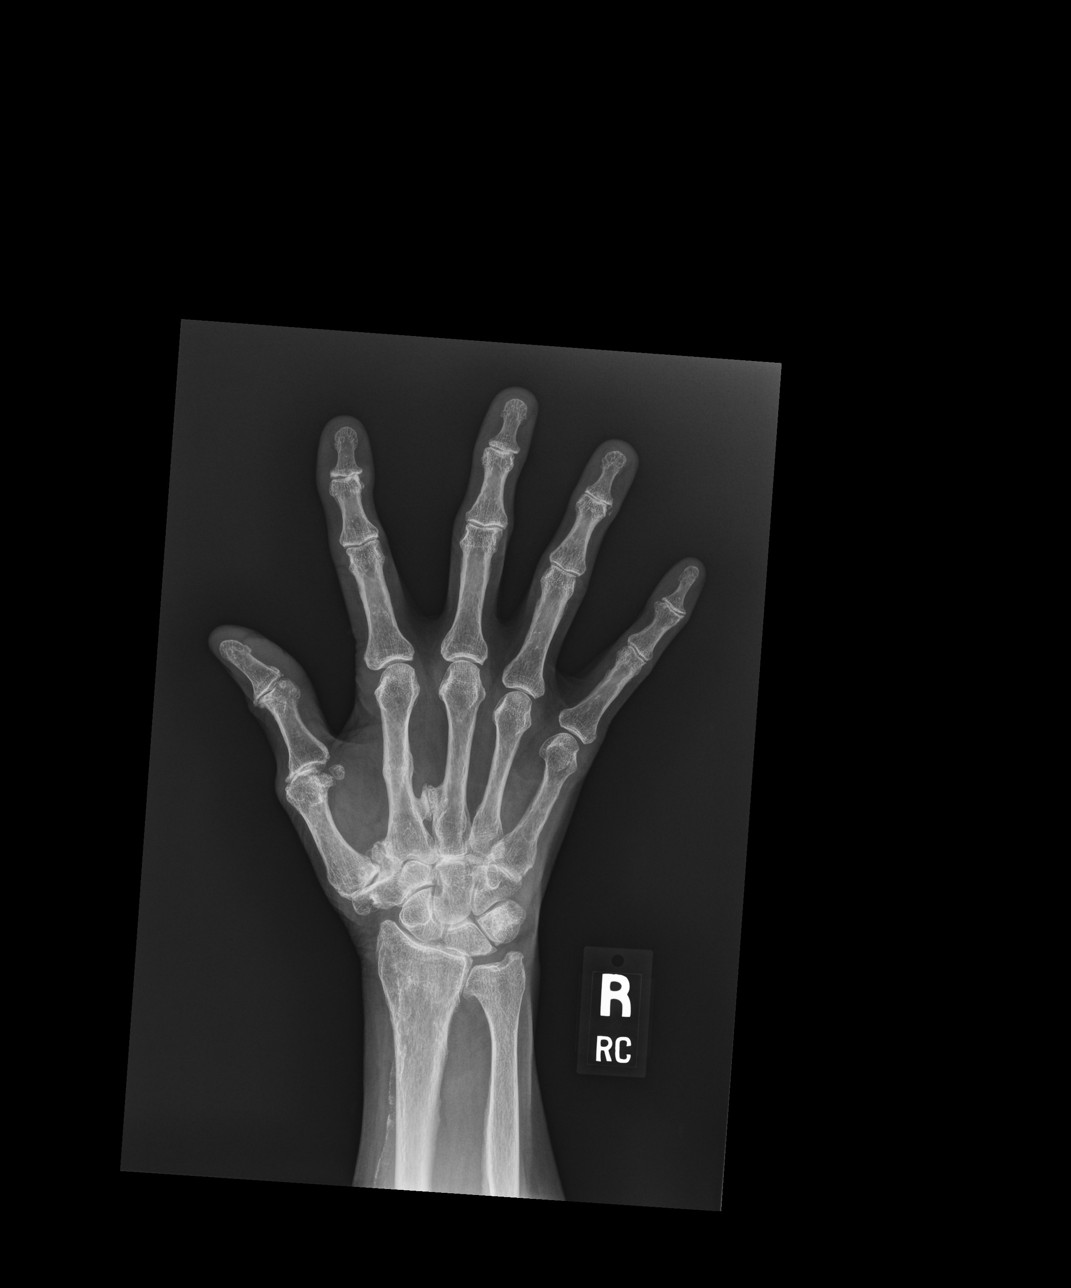

[oblique]
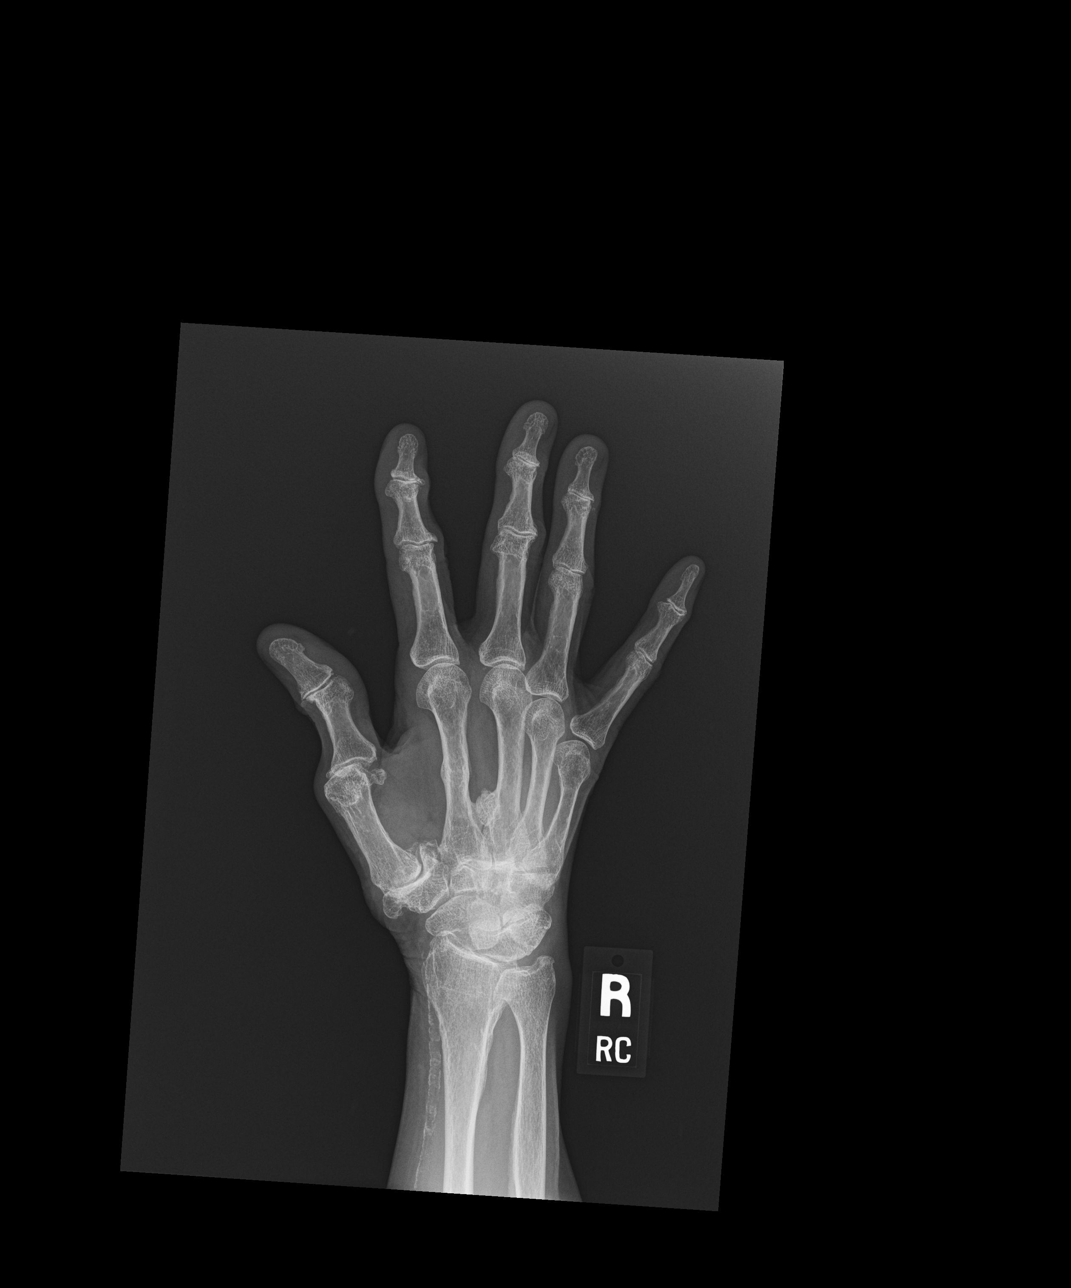

[lateral]
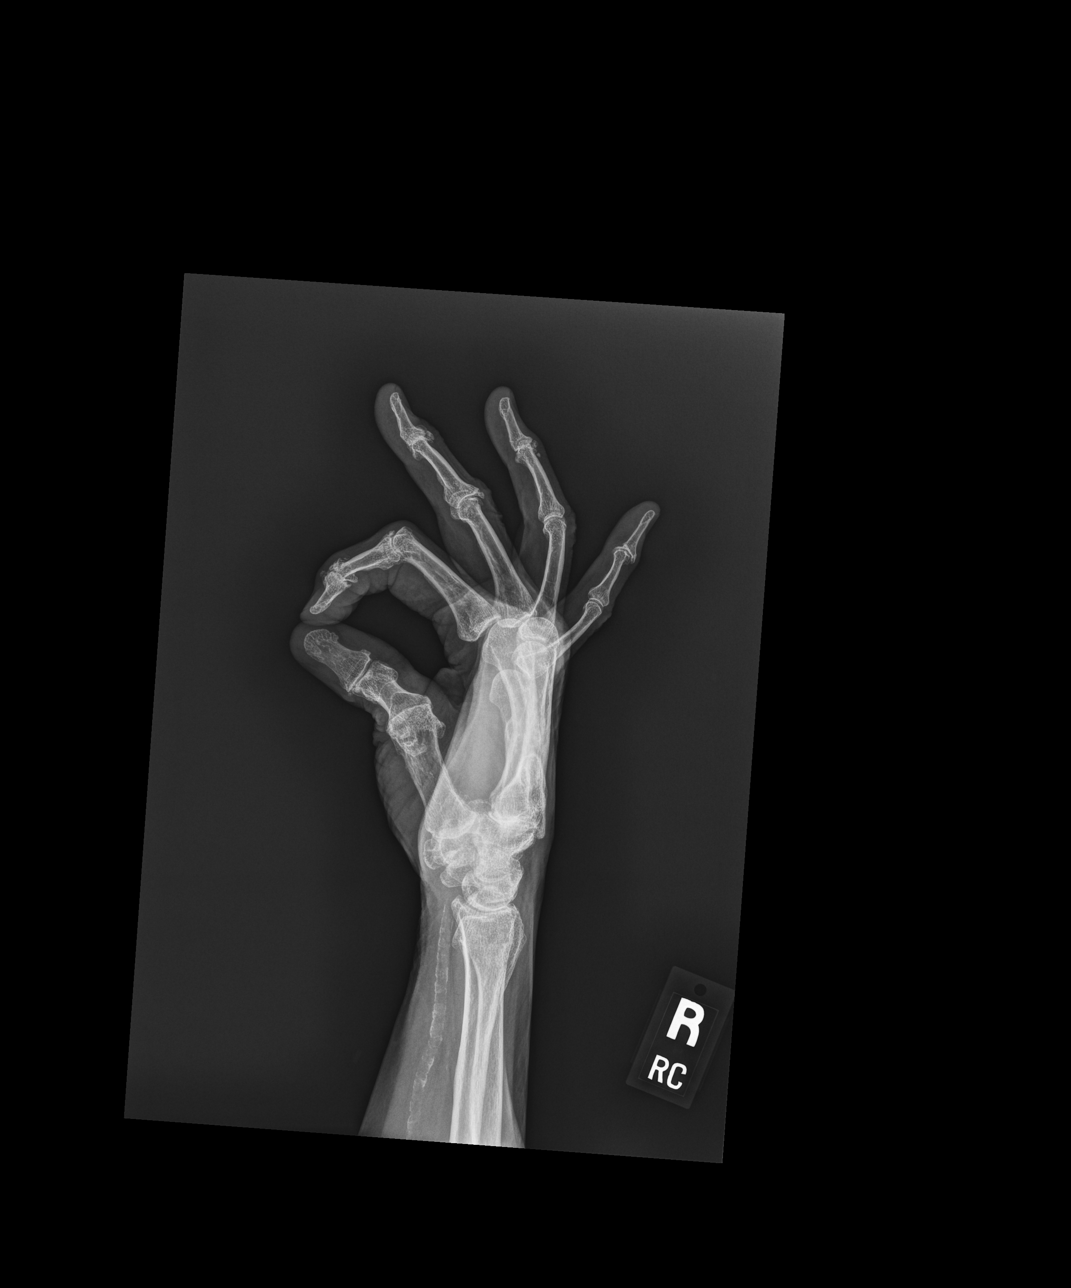

[3 of 3 positions shown; findings below may reference images not displayed]

FINDINGS: Advanced changes of rheumatoid arthritis, most marked involving all of 
the left carpometacarpal (CMC) joints. Marked left second, bilateral thumb and 
mild left third metacarpophalangeal (MCP) joint space narrowing. Marked 
degenerative change of the thumb carpometacarpal (VFV-5) joints. Moderate 
degenerative change of the distal radioulnar joints (DRUJs). Erosive 
osteoarthritis of several distal interphalangeal (DIP) joints, more marked on 
the left, with ulnar subluxation of the distal left index finger. Old right 
second mid metacarpal shaft fracture deformity and abutting right 2-3 metacarpal 
shaft well-corticated exostoses. Normal bone density. No soft tissue swelling.
IMPRESSION: Multifocal changes consistent with rheumatoid arthritis and degenerative change, 
more marked on the left.

## 2020-02-16 IMAGING — DX CHEST PA AND LATERAL
2 series · 2 of 2 positions shown · non-contrast
Comparison: None

CLINICAL INDICATION:  Hypertension and history of rheumatoid arthritis

[PA]
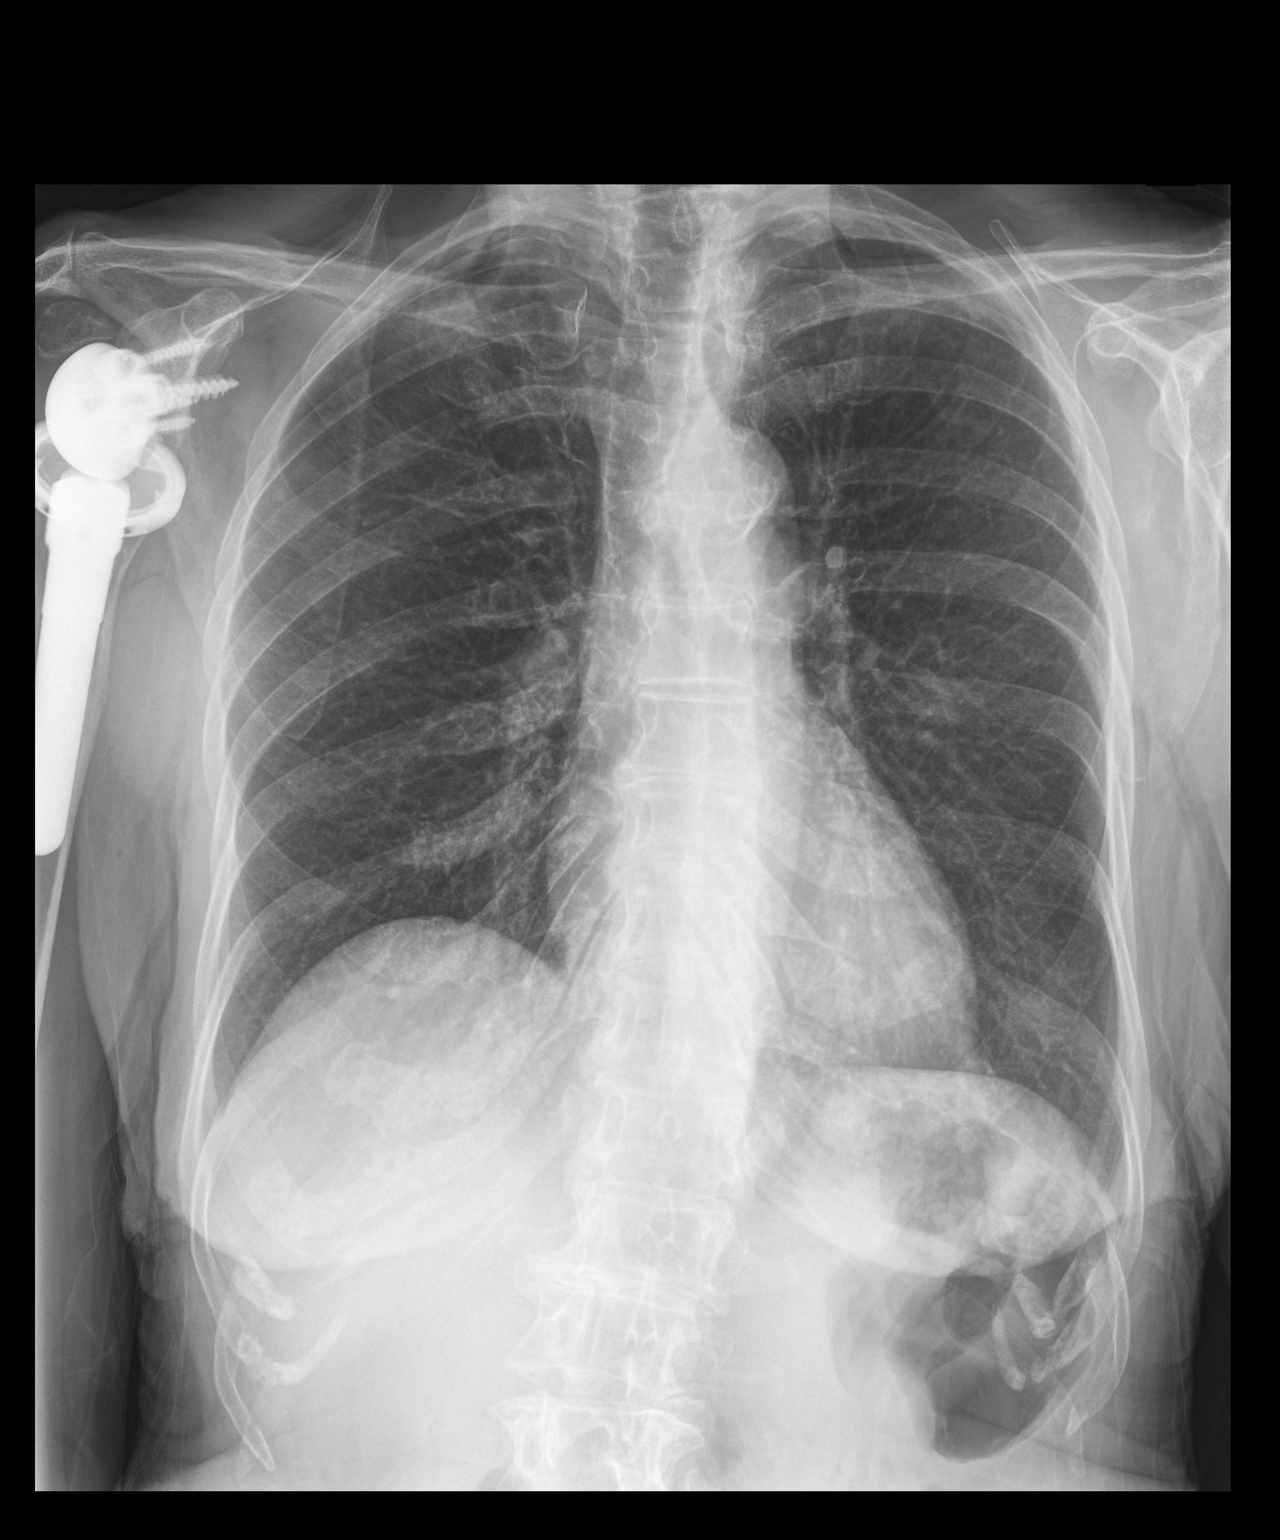

[lateral]
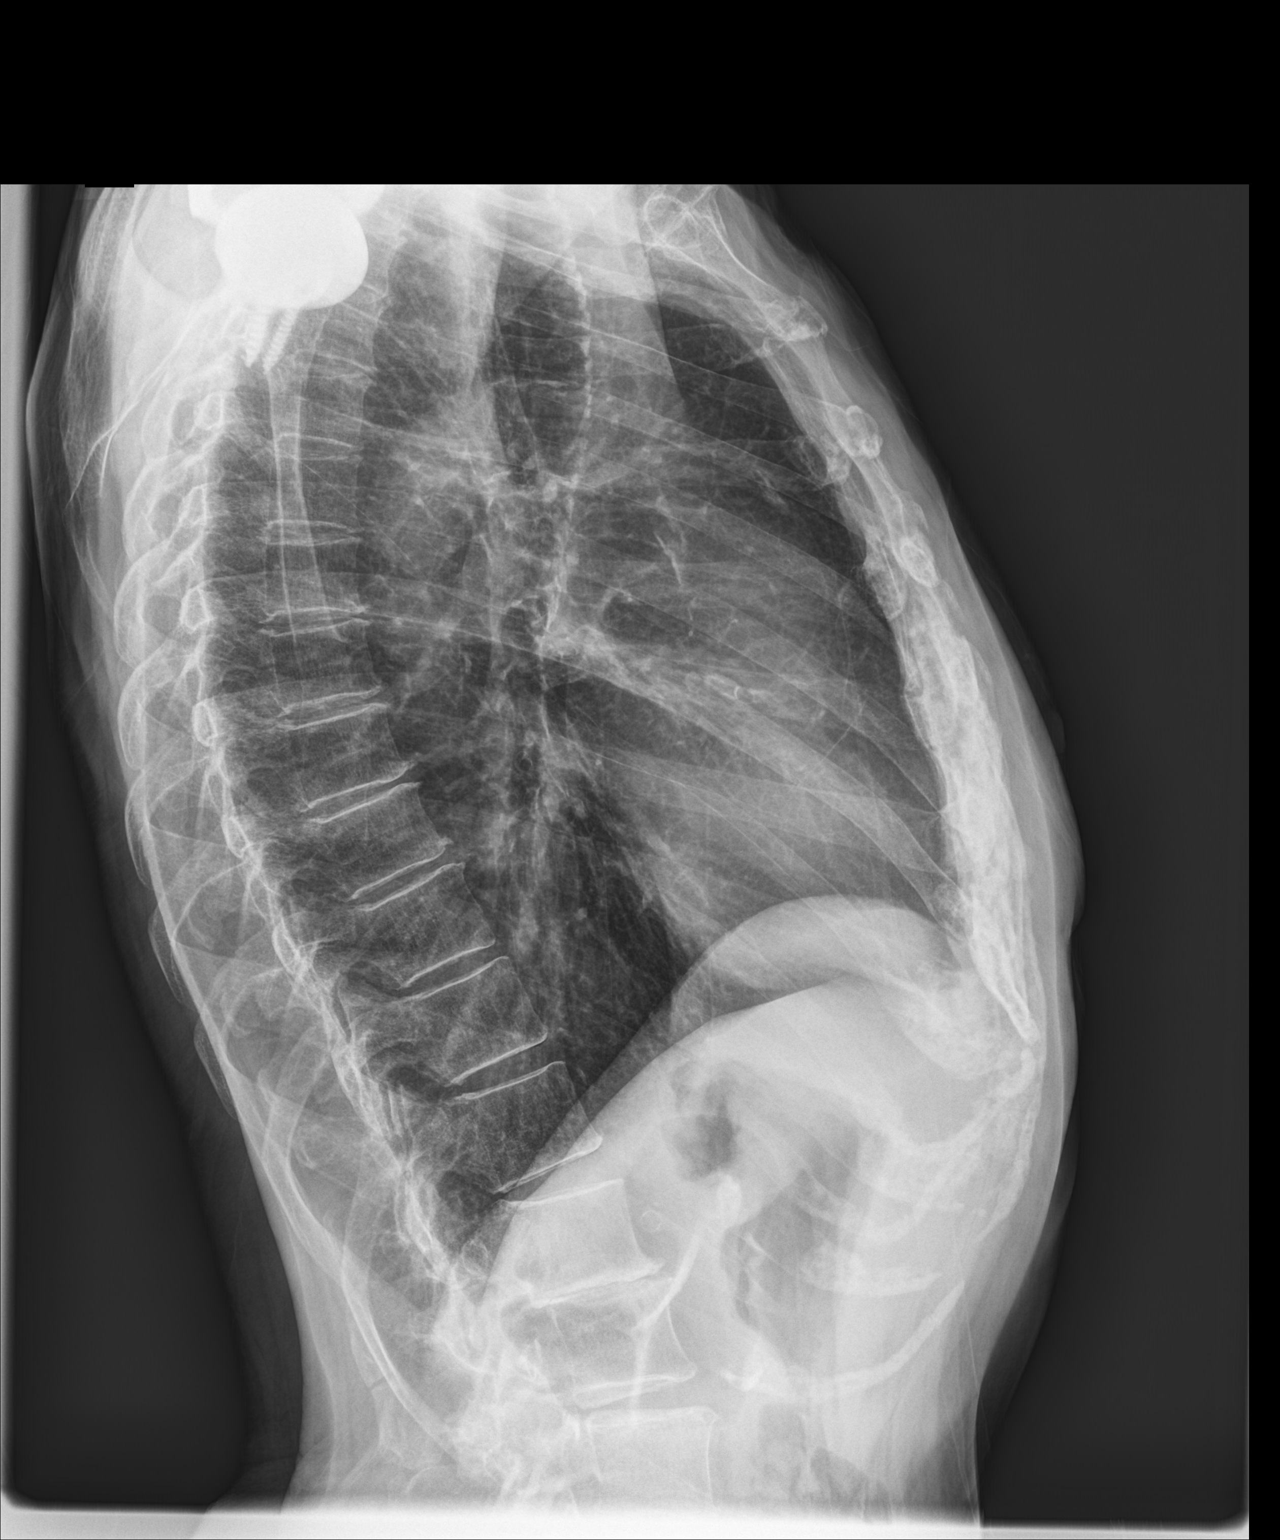

[2 of 2 positions shown; findings below may reference images not displayed]

FINDINGS: Previous right shoulder arthroplasty. Atherosclerotic changes and 
degenerative changes. No mass or consolidation. No pleural effusion seen.
IMPRESSION: No radiographic evidence of acute cardiopulmonary disease.

## 2020-02-17 IMAGING — DX CERVICAL SPINE 4 VIEWS
4 series · 4 of 4 positions shown · non-contrast
Comparison: None

CERVICAL SPINE 4 VIEWS, 02/17/2020 [DATE]: 
CLINICAL INDICATION: Rheumatoid arthritis, neck pain

[lateral]
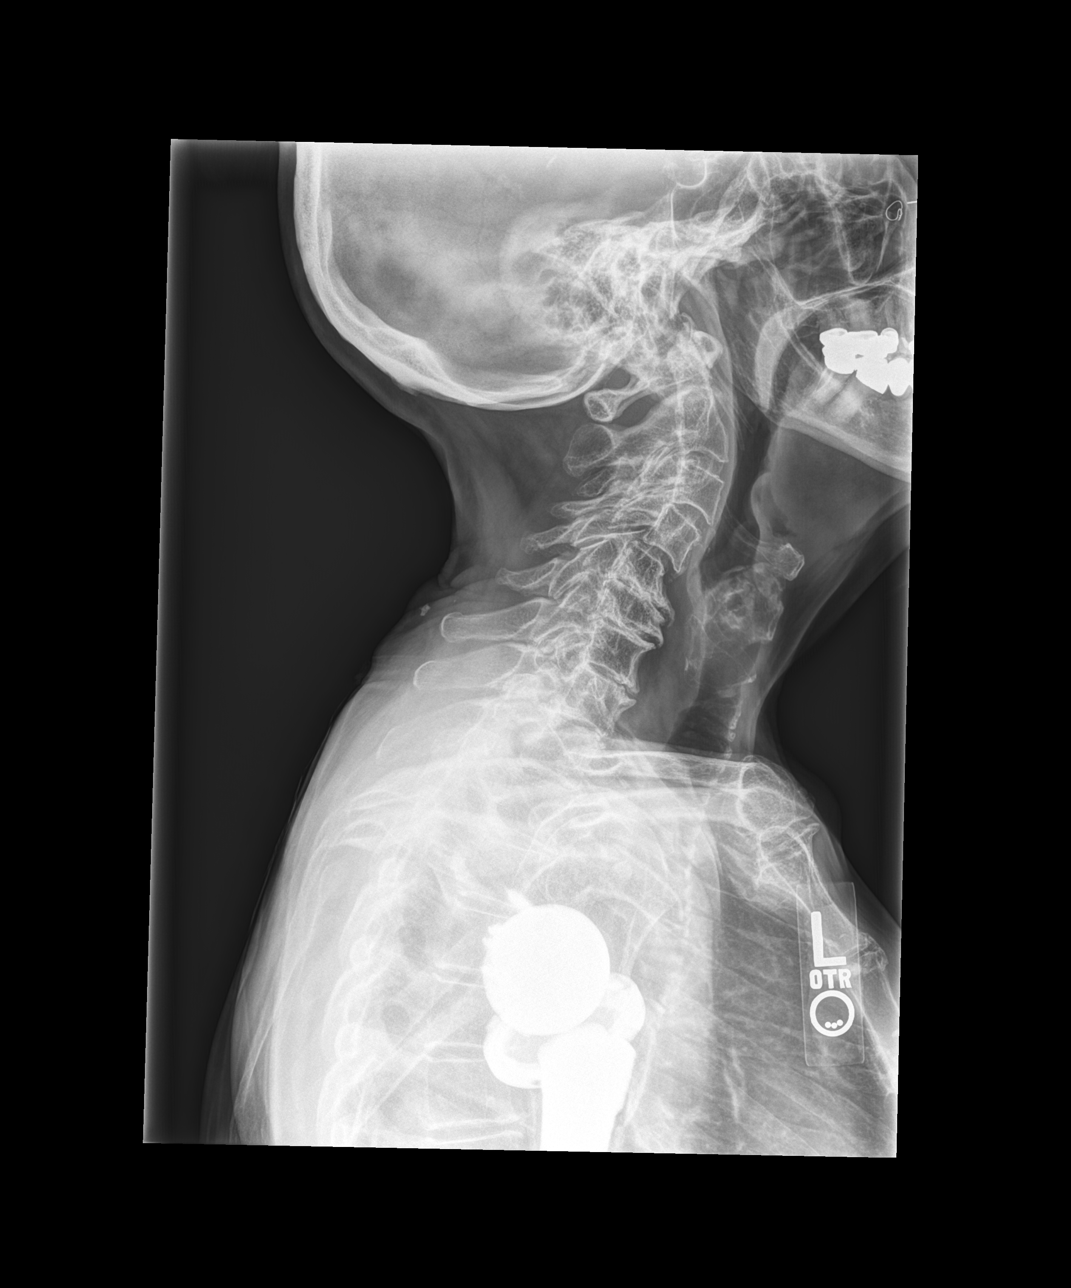

[oblique (1 of 2)]
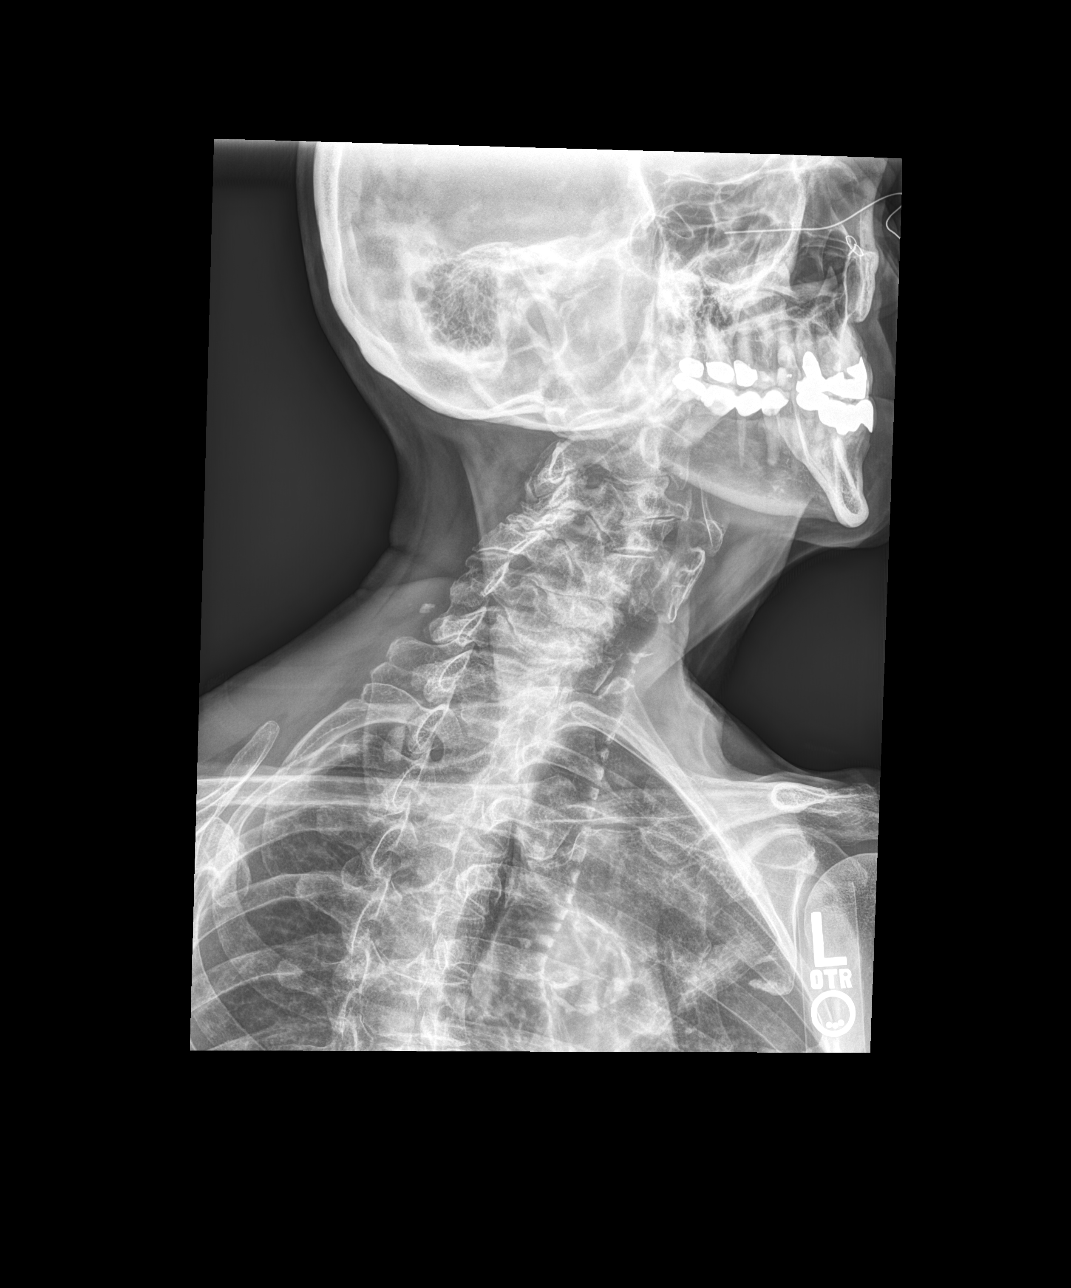

[oblique (2 of 2)]
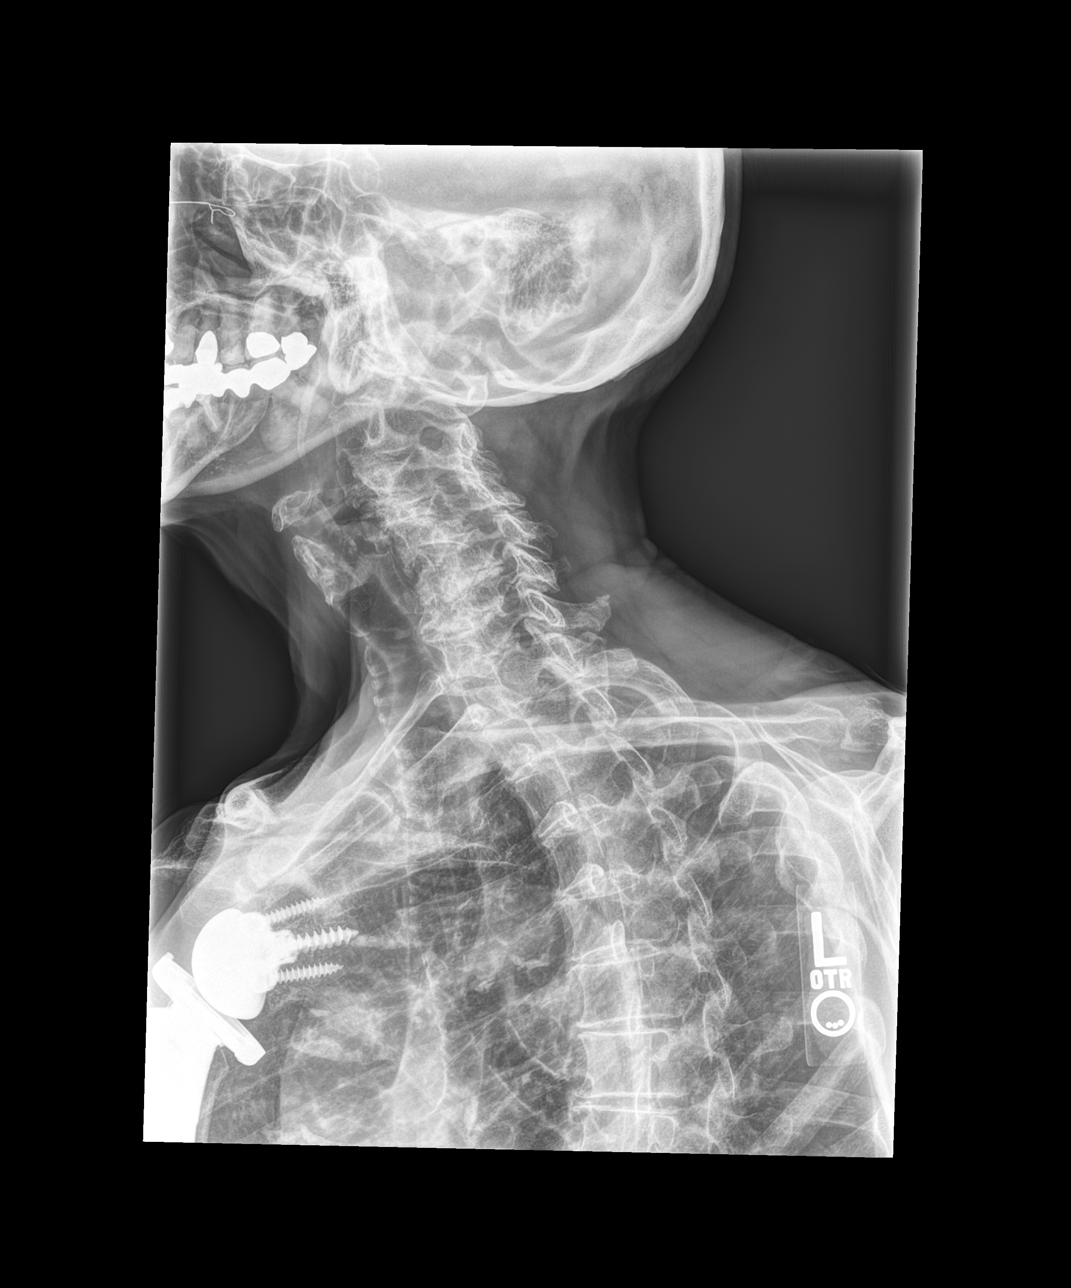

[AP]
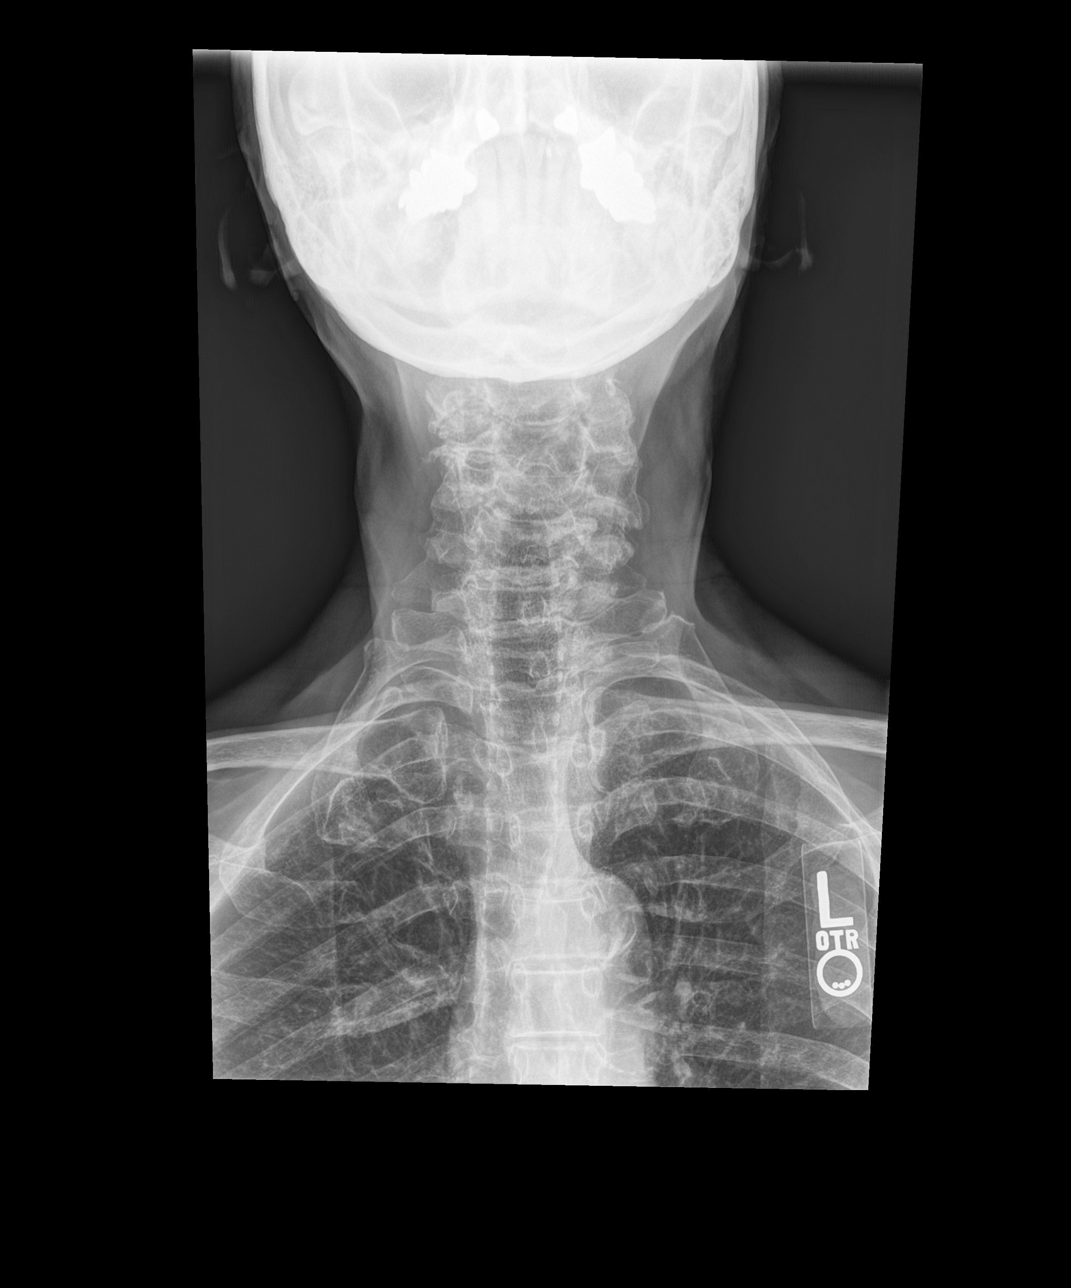

[4 of 4 positions shown; findings below may reference images not displayed]

FINDINGS: Cervical vertebral heights are intact. There is osteoporosis. The dens and 
atlantodental interval are intact. There is no evidence for basilar 
invagination. 
There is spondylosis, with marked disc narrowing at C5-6 and C6-7, also C7-T1 
and the upper thoracic spine. There is less than grade 1 anterolisthesis at 
C4-5, and there is mild reversal of cervical lordosis. There is mild cervical 
dextrocurvature. Show bilateral foraminal stenosis, appearing most pronounced on 
the right at C4-5, on the left at C5-6. Oblique views
IMPRESSION: Moderately advanced spondylosis. MRI would be useful for further evaluation. 
The dens and atlantodental interval are intact. There is no evidence for basilar 
invagination or facet ankylosis.

## 2020-03-09 IMAGING — MR MRI ORBITS FACE OR NECK  W/WO CONTRAST
13 of 14 series · 44 of 48 positions shown · IV contrast (gadavist)
Comparison: Cervical radiograph February 17, 2020, CT neck August 22, 2016 outside 
facility

MRI ORBITS FACE OR NECK  W/WO CONTRAST, 03/09/2020 [DATE]: 
CLINICAL INDICATION: Left-sided neck mass, difficulty swallowing
TECHNIQUE: Multiplanar, multiecho position MR images of the neck were performed 
with and without intravenous enhancement.  4 cc's of gadavist is injected 
intravenously by hand. The patient's eGFR was calculated to be 71 using the 
i-STAT device.

[Series 101: survey · axial · 10.0mm · 0.94mm/px · 1 of 5 slices shown]
[im 1/5]
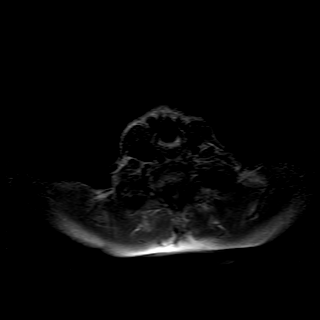

[Series 201: T2 · sagittal · 4.0mm · 0.42mm/px · 2 of 30 slices shown]
[im 1/30]
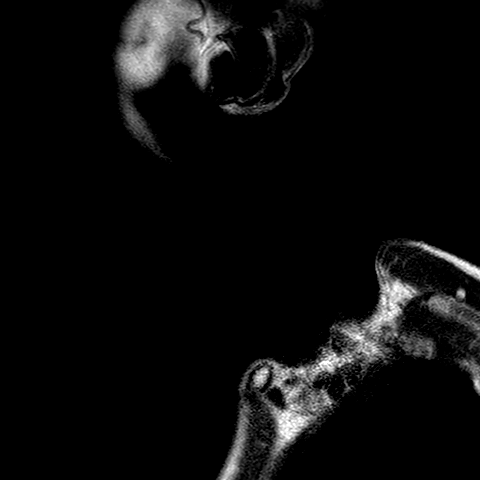
[im 30/30]
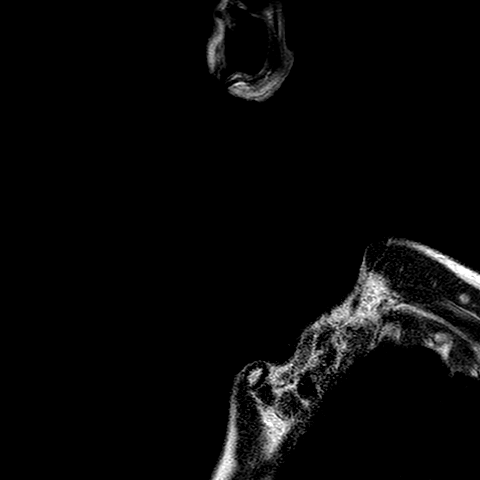

[Series 302: (id)_(person_name)/(person_name)_parotid · axial · 3.5mm · 0.69mm/px · z∈[-44,+95]mm · 3 of 32 slices shown (1 of 2)]
[im 1/32]
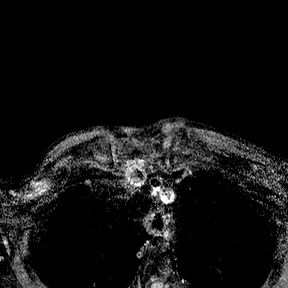
[im 16/32]
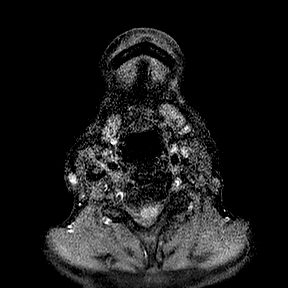
[im 32/32]
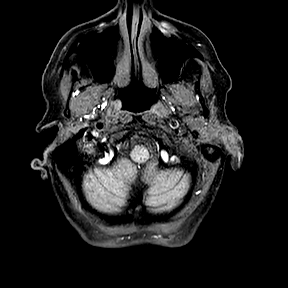

[Series 303: (id) (person_name)_parotid · axial · 3.5mm · 0.69mm/px · z∈[-44,+95]mm · 3 of 32 slices shown (1 of 2)]
[im 1/32]
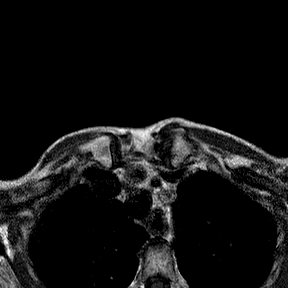
[im 16/32]
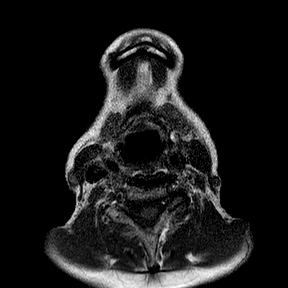
[im 32/32]
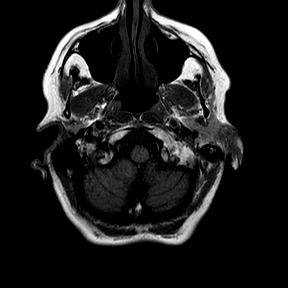

[Series 402: st2w fs mdixon_tse_rl · axial · 3.5mm · 0.60mm/px · z∈[-22,+118]mm · 3 of 32 slices shown]
[im 1/32]
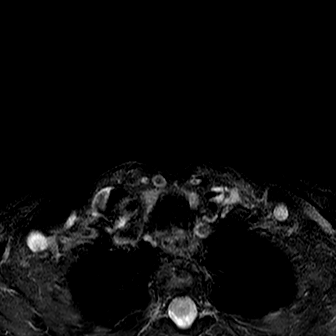
[im 16/32]
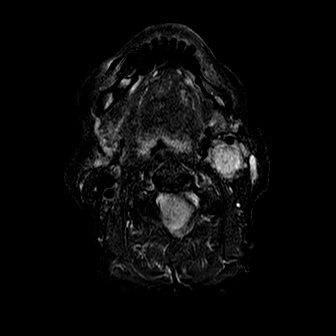
[im 32/32]
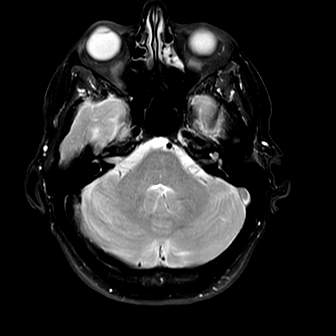

[Series 403: st2w mdixon_tse_rl · axial · 3.5mm · 0.60mm/px · z∈[-22,+118]mm · 4 of 32 slices shown]
[im 1/32]
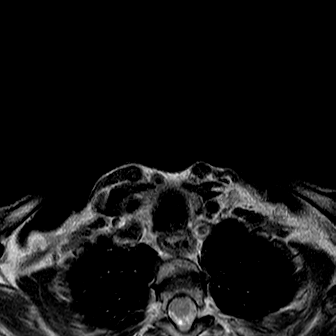
[im 11/32]
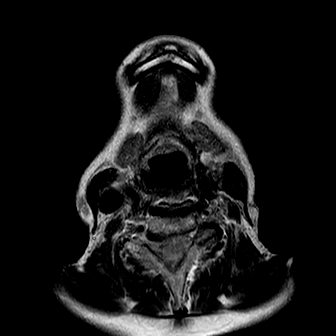
[im 21/32]
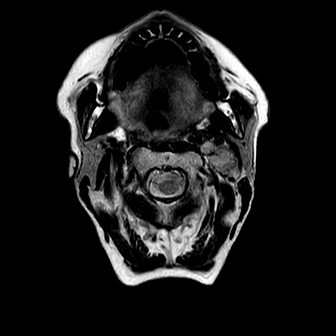
[im 32/32]
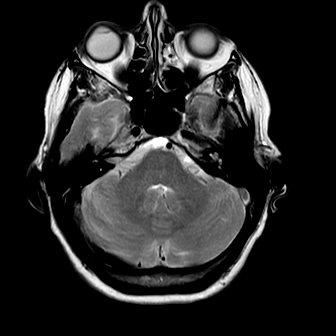

[Series 502: (id)_(person_name)/(person_name)_parotid · axial · 3.5mm · 0.69mm/px · z∈[-22,+118]mm · 4 of 32 slices shown (2 of 2)]
[im 1/32]
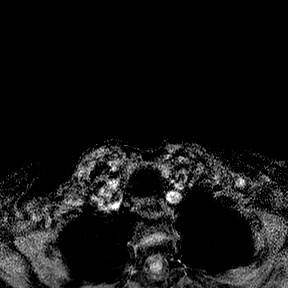
[im 11/32]
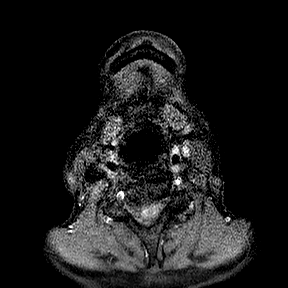
[im 21/32]
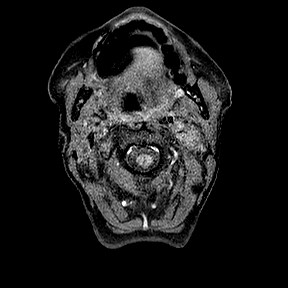
[im 32/32]
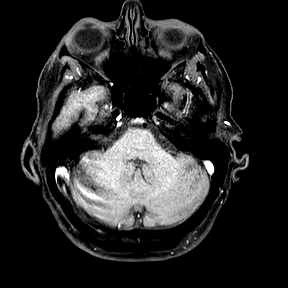

[Series 503: (id) (person_name)_parotid · axial · 3.5mm · 0.69mm/px · z∈[-22,+118]mm · 4 of 32 slices shown (2 of 2)]
[im 1/32]
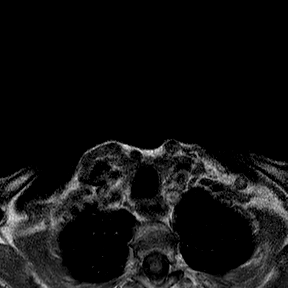
[im 11/32]
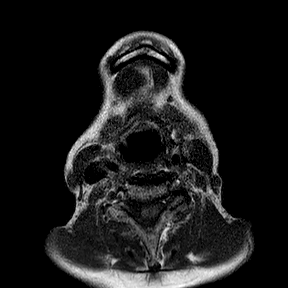
[im 21/32]
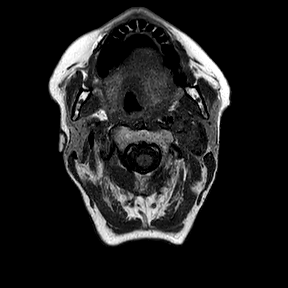
[im 32/32]
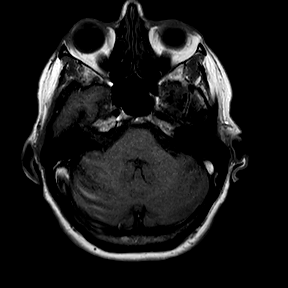

[Series 602: (id) (person_name)_(person_name)_(person_name) · coronal · 3.5mm · 0.62mm/px · 4 of 32 slices shown (1 of 2)]
[im 1/32]
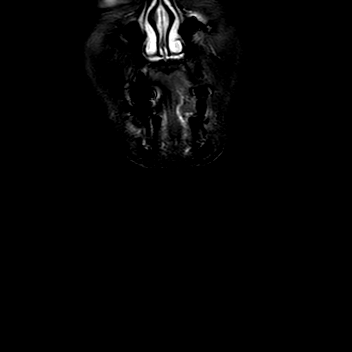
[im 11/32]
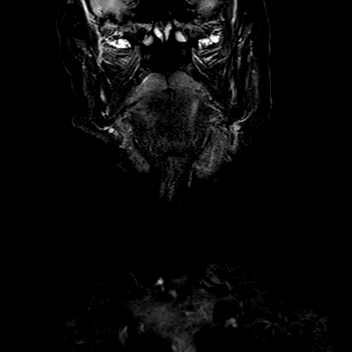
[im 21/32]
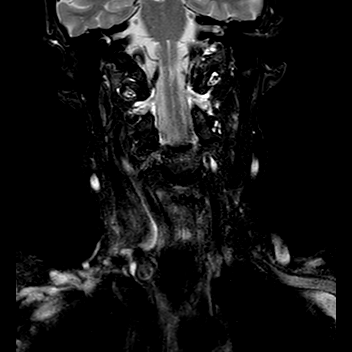
[im 32/32]
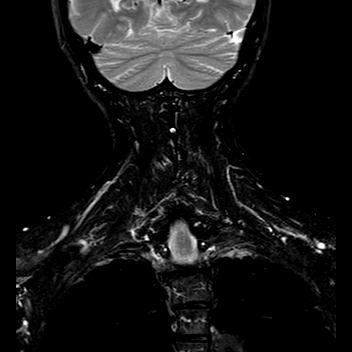

[Series 603: (id) (person_name)_(person_name)_(person_name) · coronal · 3.5mm · 0.62mm/px · 4 of 32 slices shown (2 of 2)]
[im 1/32]
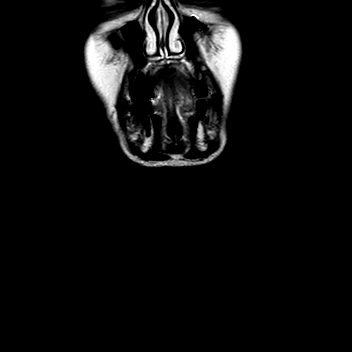
[im 11/32]
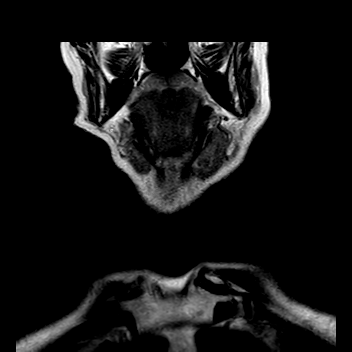
[im 21/32]
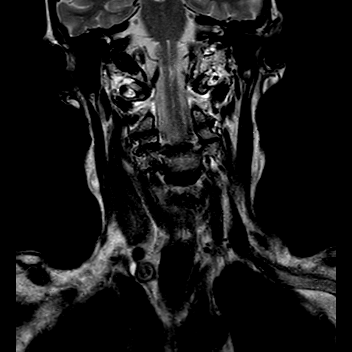
[im 32/32]
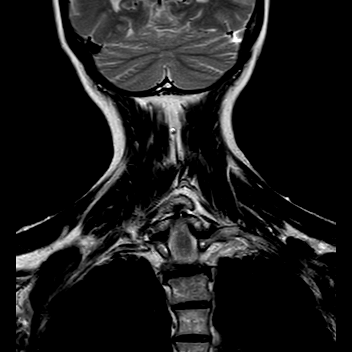

[Series 702: sgad (person_name)1(person_name)_(person_name)/(person_name)_parotid · axial · 3.5mm · 0.69mm/px · z∈[-22,+118]mm · 4 of 32 slices shown]
[im 1/32]
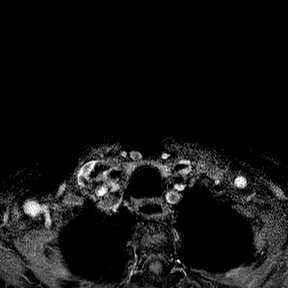
[im 11/32]
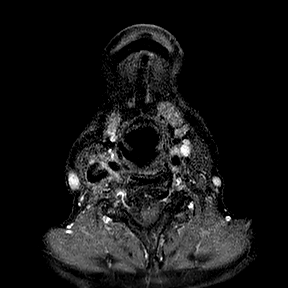
[im 21/32]
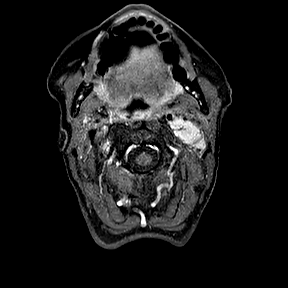
[im 32/32]
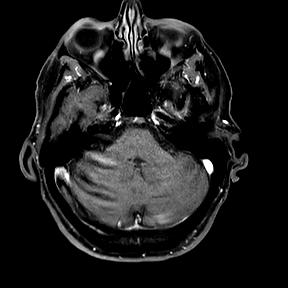

[Series 703: sgad (person_name)1(person_name)_parotid · axial · 3.5mm · 0.69mm/px · z∈[-22,+118]mm · 4 of 32 slices shown]
[im 1/32]
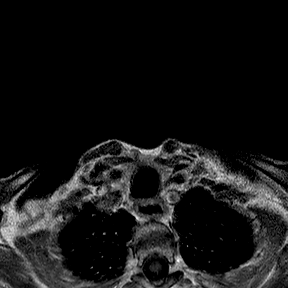
[im 11/32]
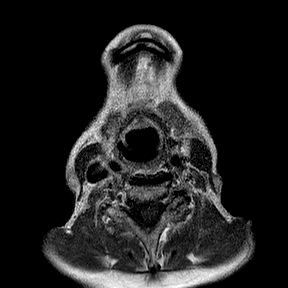
[im 21/32]
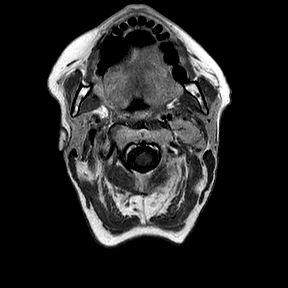
[im 32/32]
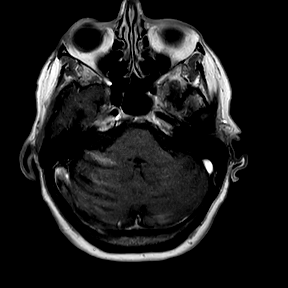

[Series 802: (person_name)1(person_name)_(person_name)_(person_name)_(person_name) · coronal · 3.5mm · 0.62mm/px · 4 of 32 slices shown]
[im 1/32]
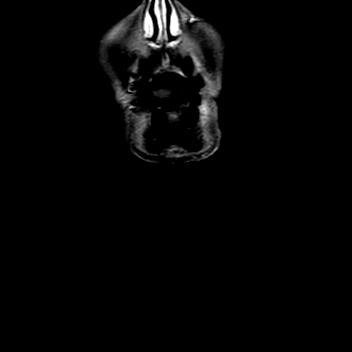
[im 11/32]
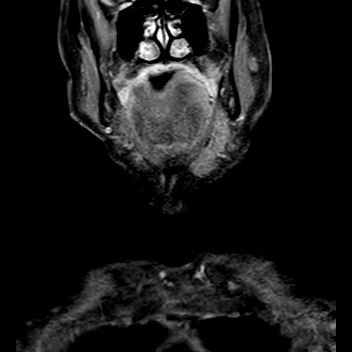
[im 21/32]
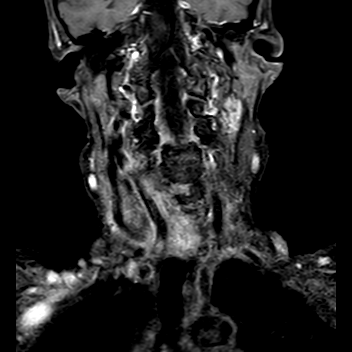
[im 32/32]
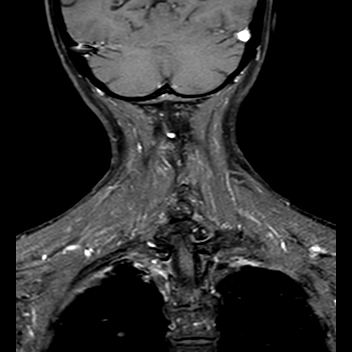

[44 of 48 positions shown; findings below may reference images not displayed]

FINDINGS: Review of the prior August 2016 CT shows an intensely enhancing 
lesion at the left carotid bifurcation measuring 3 cm vertically, which most 
likely represented chemodectoma. 
Today's enhanced MRI shows the enhancing mass, along the posterior aspect of the 
left carotid bulb and proximal internal carotid artery, measuring 2.7 cm AP 
dimension, 5.1 cm vertically. There is no adenopathy. Note made of enhancement 
within the lower left internal jugular vein on the fat suppressed enhanced 
images. No other cervical mass. The enhancing lesion produces mild deformity on 
the inferomedial aspect of the superficial left parotid lobe and deep lobe 
parotid. There is effacement of the proximal left jugular vein.
IMPRESSION: There is a 5.1 x 2.7 cm enhancing lesion at the left carotid bifurcation, with 
signal changes characteristic for chemodectoma. This produces mild mass effect 
on the adjacent salivary glands, effacement of the proximal left internal 
jugular vein, adjacent to the intima of the left carotid bulb and proximal 
internal carotid artery. 
No evidence for regional adenopathy or other cervical mass. 
Flow is maintained within the left sigmoid sinus and jugular bulb. The lower 
cervical left internal jugular vein is open. The right IJ is dominant.

## 2020-08-02 IMAGING — MG MAMMOGRAPHY SCREENING BILATERAL 3D TOMOSYNTHESIS WITH CAD
9 series · 9 of 29 positions shown · non-contrast
Comparison: Comparison was made to prior examinations.

MAMMOGRAPHY SCREENING BILATERAL 3D TOMOSYNTHESIS WITH CAD, 08/02/2020 [DATE]: 
CLINICAL INDICATION: Screening exam.
TECHNIQUE: Digital bilateral mammograms and 3-D Tomosynthesis were obtained. 
These were interpreted both primarily and with the aid of computer-aided 
detection system. 
BREAST DENSITY: (Level B) There are scattered areas of fibroglandular density.

[R CC]
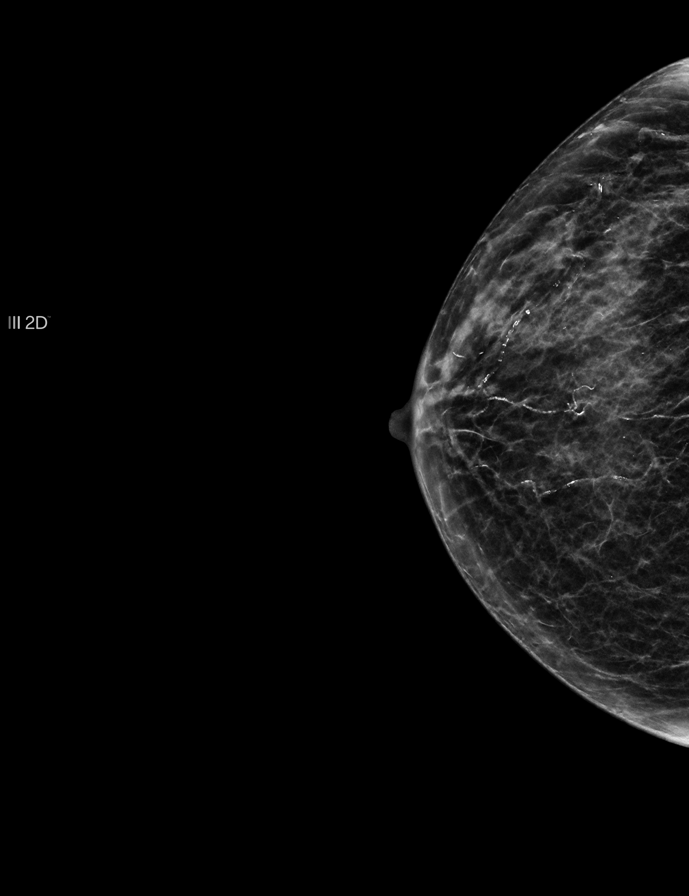

[L CC]
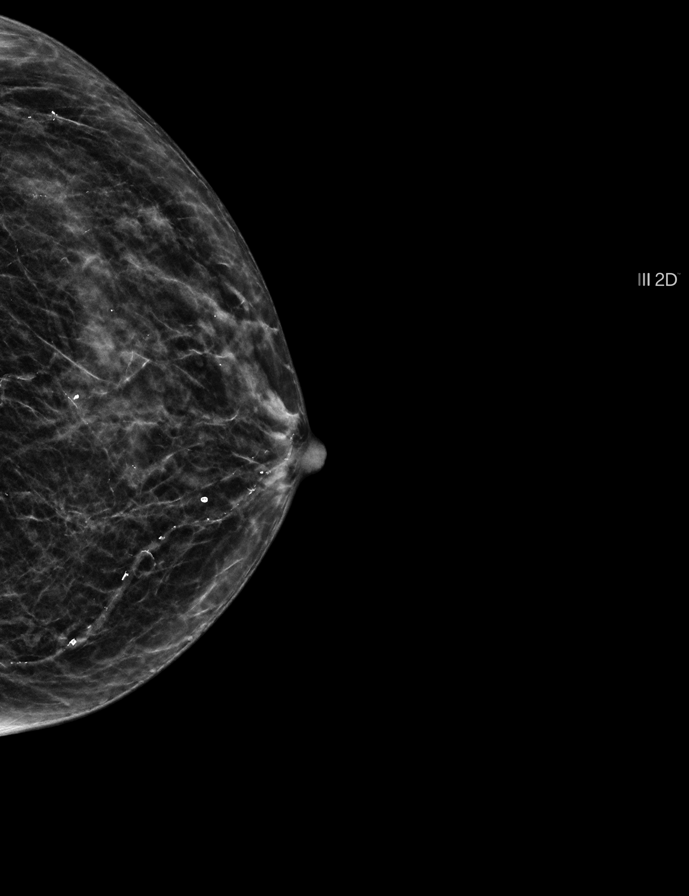

[R MLO (1 of 2)]
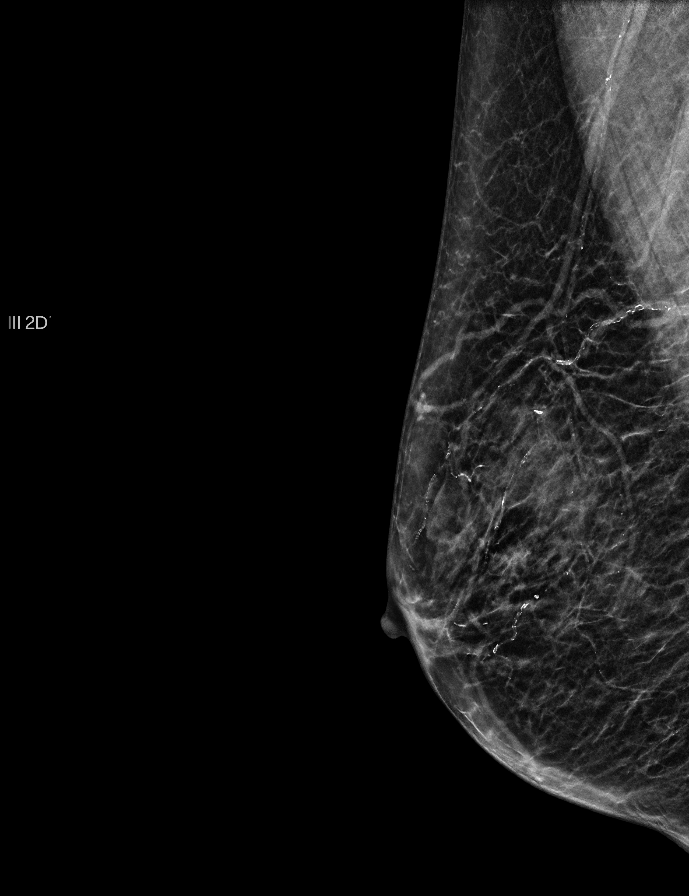

[R MLO (2 of 2)]
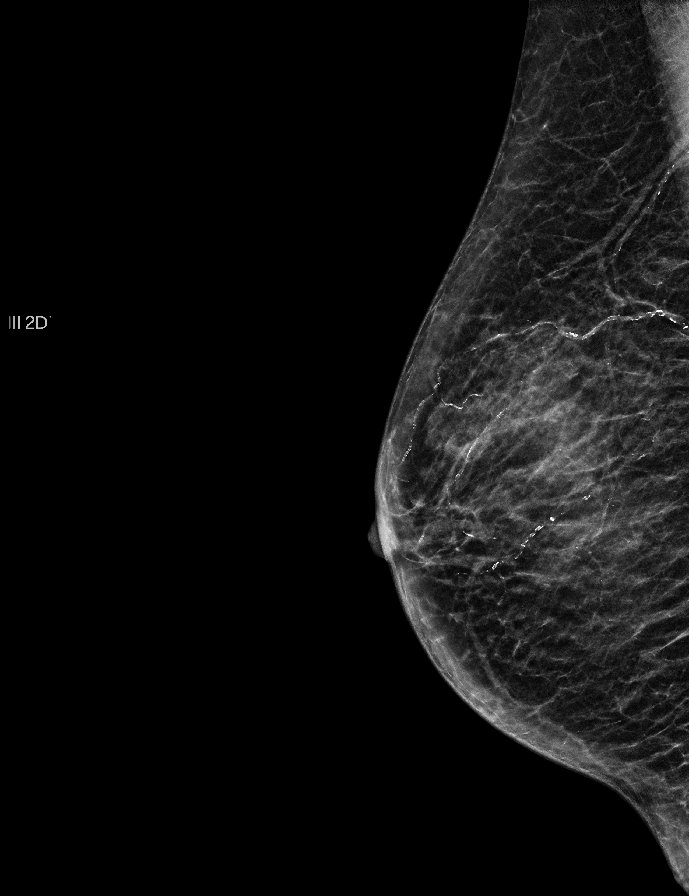

[R MLO tomo (1 of 2) · tomo slice 15/29.0]
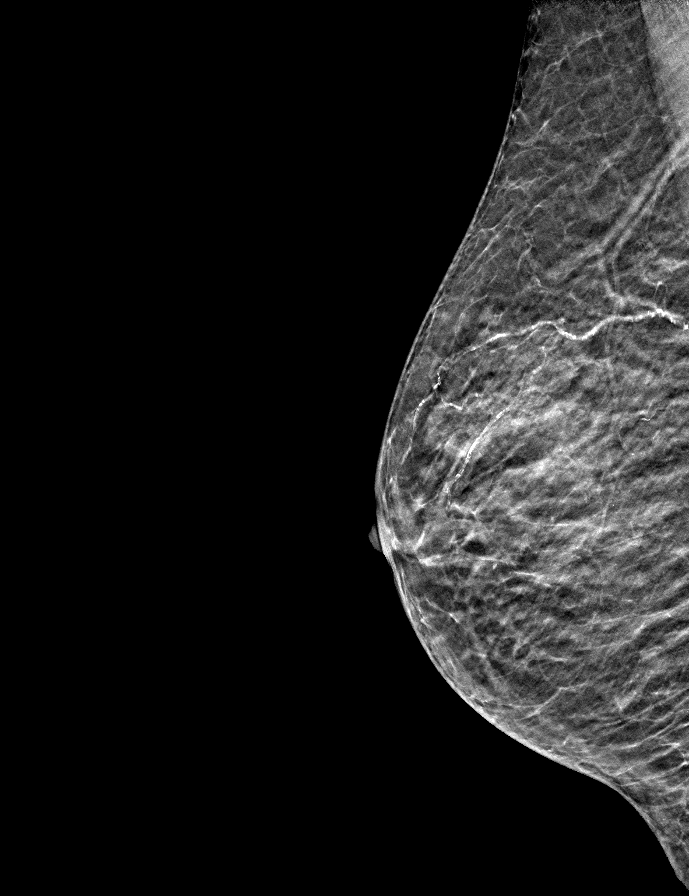

[L CC tomo · tomo slice 16/31.0]
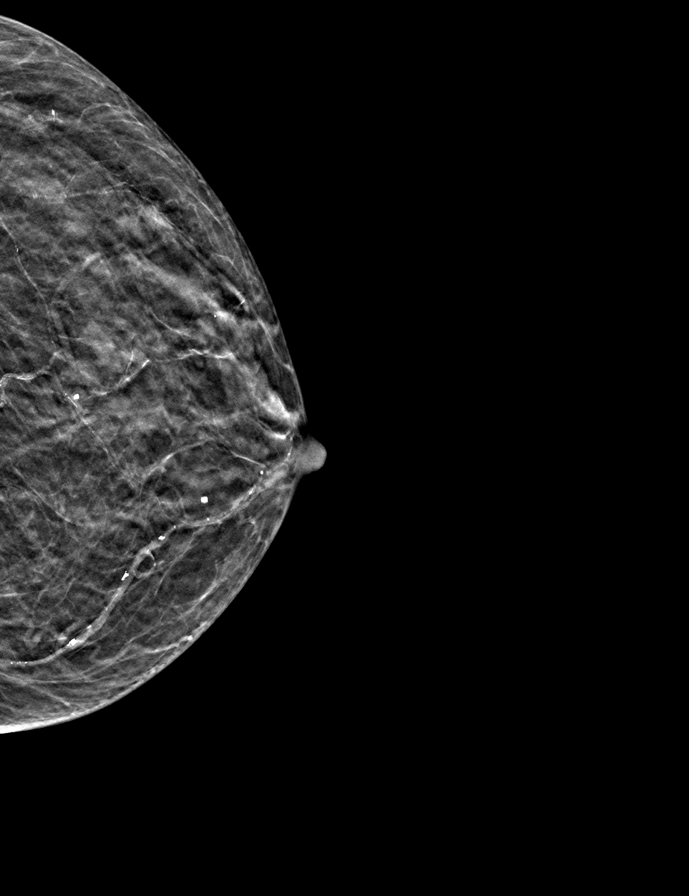

[R MLO tomo (2 of 2) · tomo slice 16/31.0]
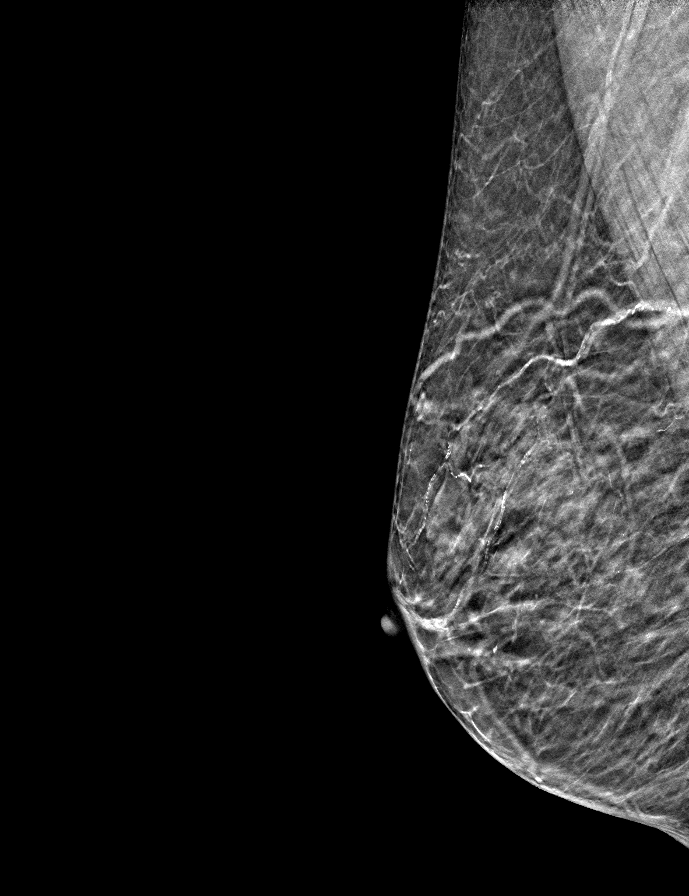

[R CC tomo · tomo slice 15/30.0]
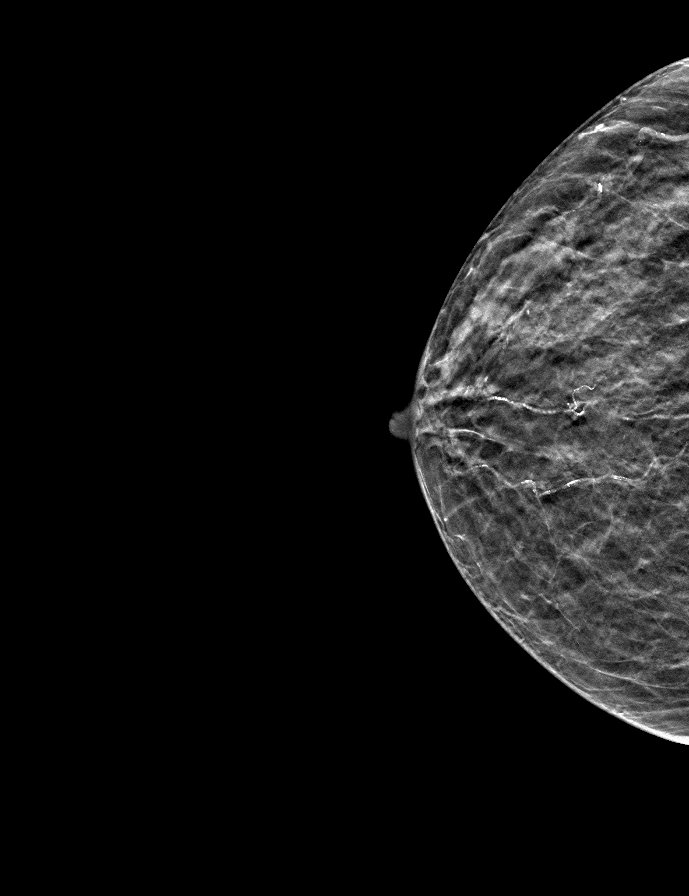

[L MLO tomo · tomo slice 17/32.0]
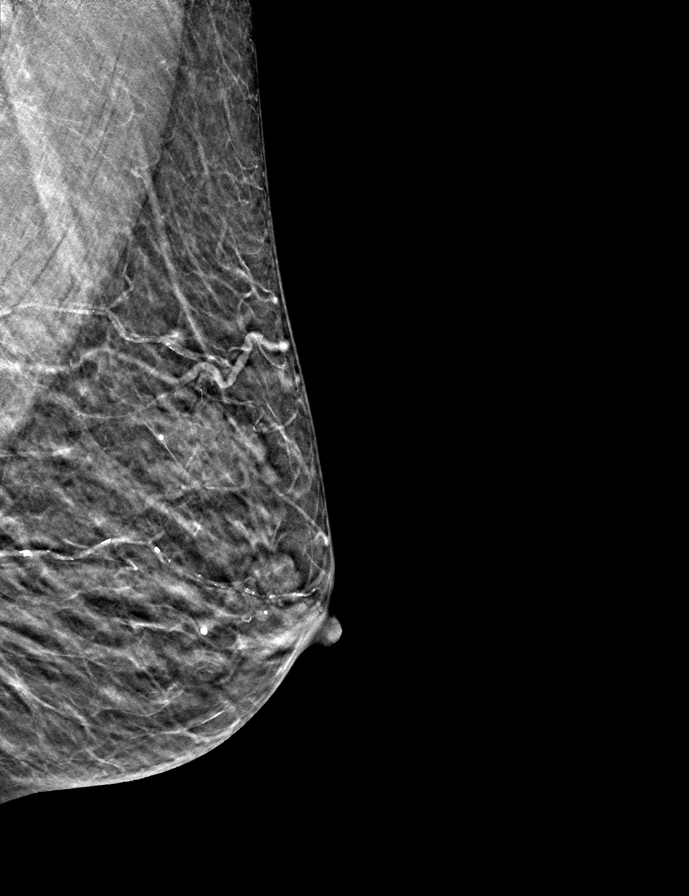

[9 of 29 positions shown; findings below may reference images not displayed]

FINDINGS: No suspicious mass, calcifications, or area of architectural 
distortion in either breast.
IMPRESSION: No mammographic evidence of malignancy in either breast. 
( BI-RADS 1) Negative mammogram. Routine mammographic follow-up is recommended.

## 2020-08-12 IMAGING — CT CT LUMBAR SPINE WITHOUT CONTRAST
3 of 7 series · 10 of 33 positions shown, 12 images · non-contrast
Comparison: Lumbar MRI 12/11/2019.

CT LUMBAR SPINE WITHOUT CONTRAST, 08/12/2020 [DATE]: 
CLINICAL INDICATION:  CT to evaluate for scoliosis and spondylolisthesis. 
Patient complains of low back pain. 
A search for DICOM formatted images was conducted for prior CT imaging studies 
completed at a non-affiliated media free facility.
TECHNIQUE: The lumbar spine was scanned from T12 through mid-sacrum without 
contrast on a high-resolution CT scanner using dose reduction techniques. 
Routing MPR reconstructions were performed.

[Series 2: axial · axial · 0.36mm/px · z∈[-254,-130]mm · 2 of 140 slices shown, 3 images]
[im 47/140  soft-tissue]
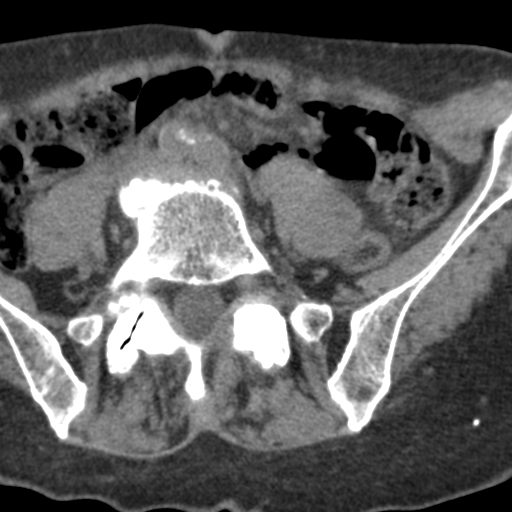
[im 47/140  bone]
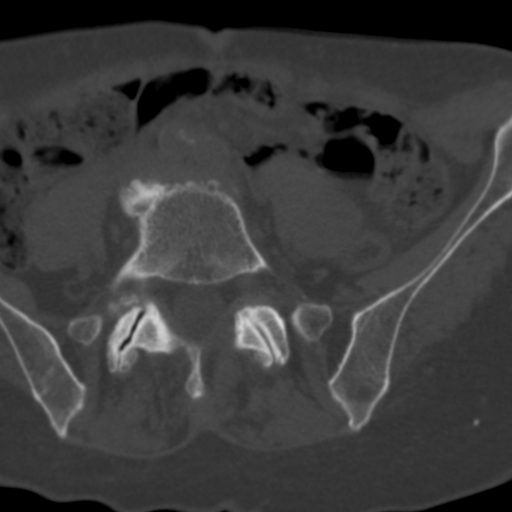
[im 109/140  bone]
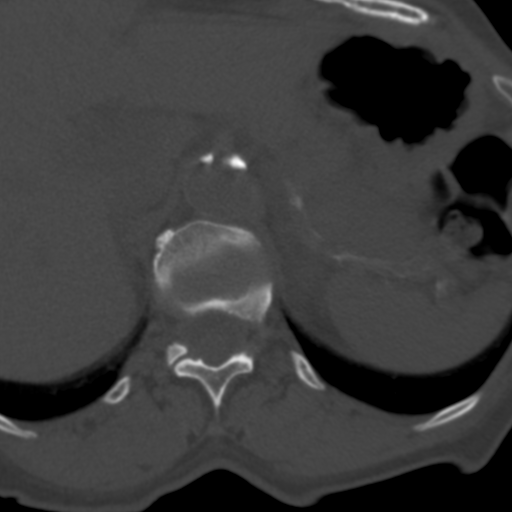

[Series 4: cor · coronal · 0.36mm/px · 3 of 60 slices shown]
[im 12/60  bone]
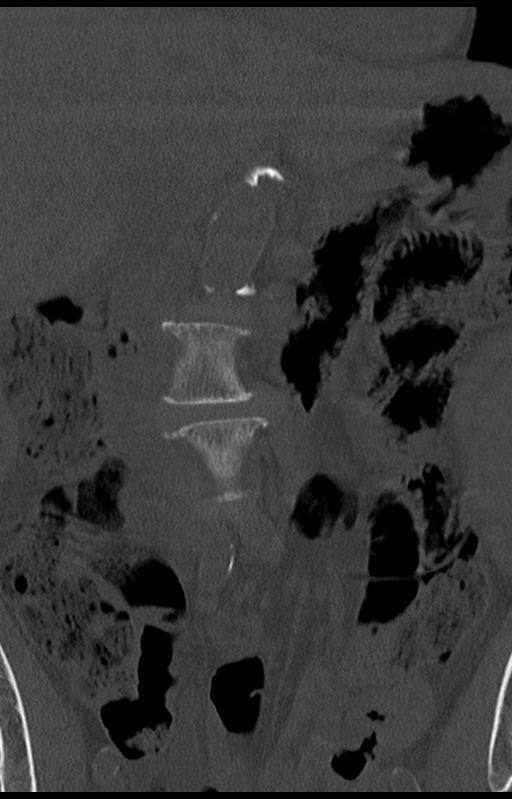
[im 24/60  bone]
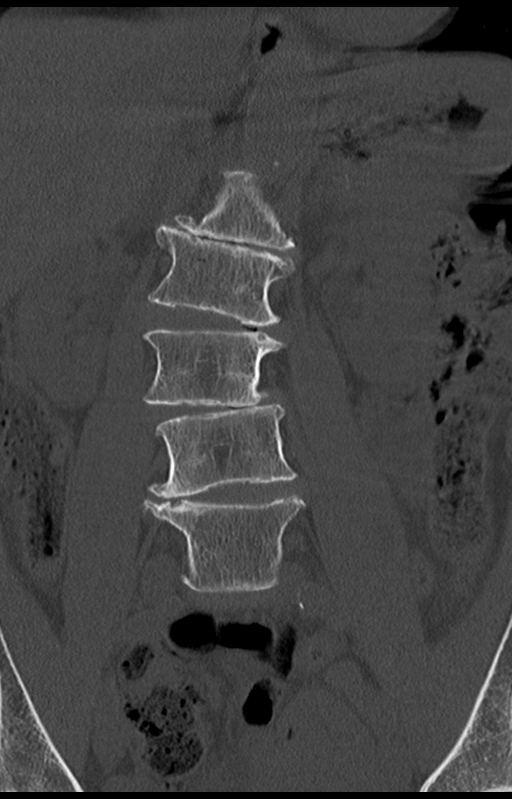
[im 36/60  bone]
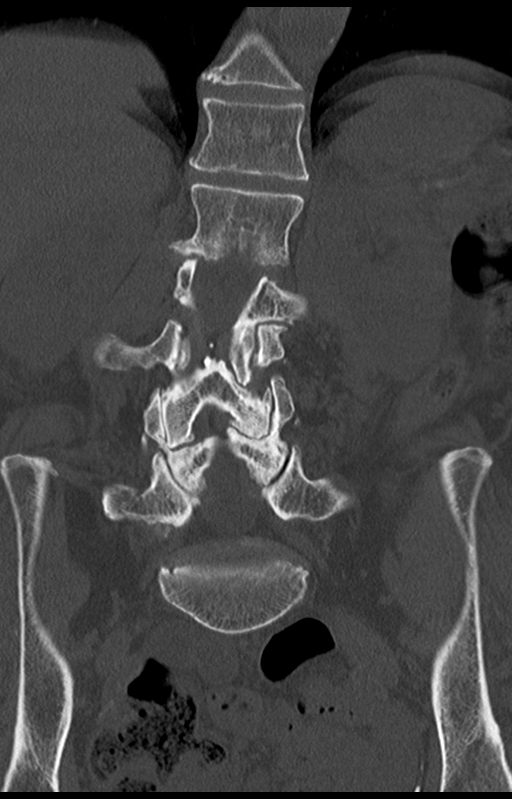

[Series 6: sag st · sagittal · 0.37mm/px · 5 of 67 slices shown, 6 images]
[im 23/67  bone]
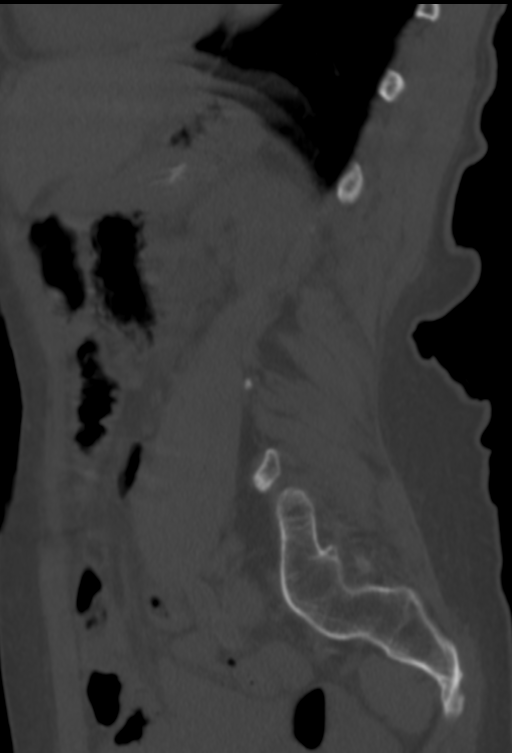
[im 28/67  bone]
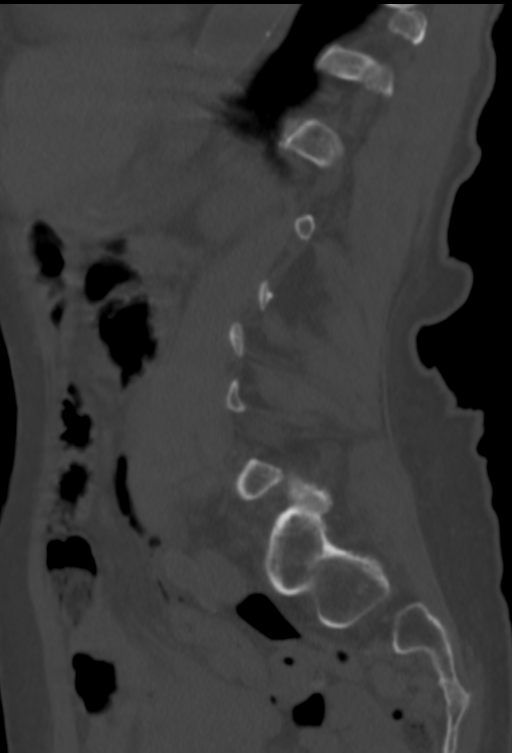
[im 34/67  soft-tissue]
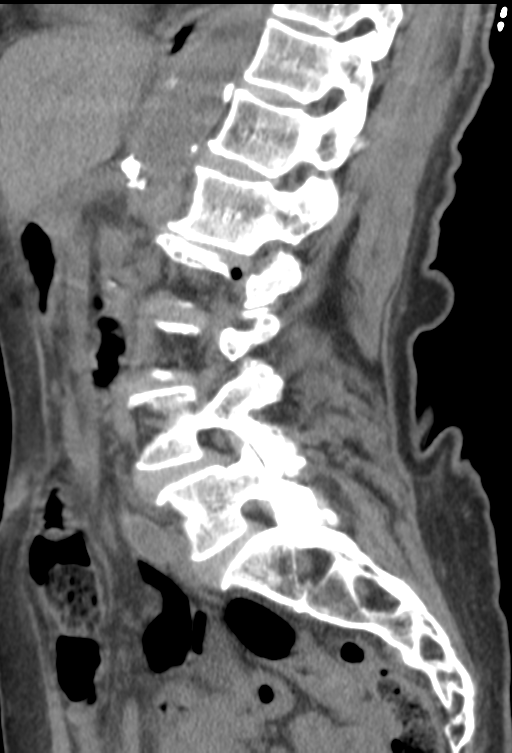
[im 34/67  bone]
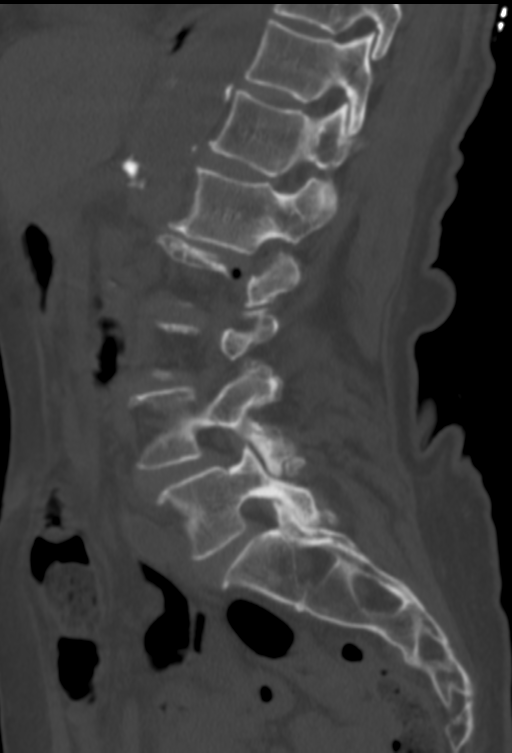
[im 39/67  bone]
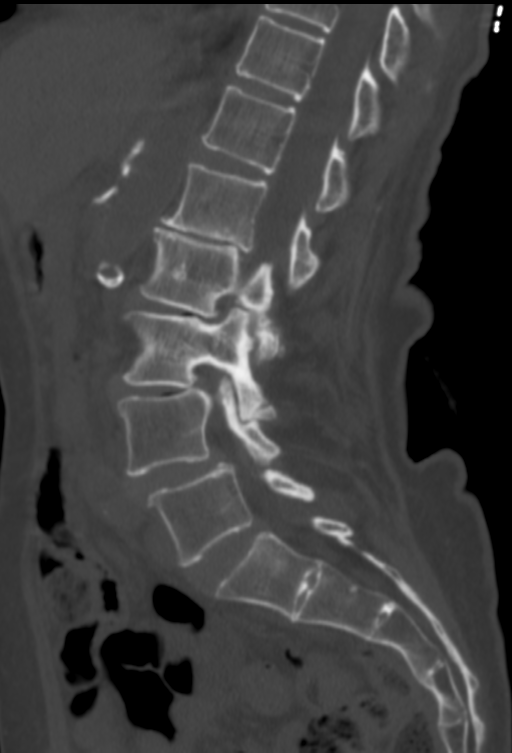
[im 45/67  bone]
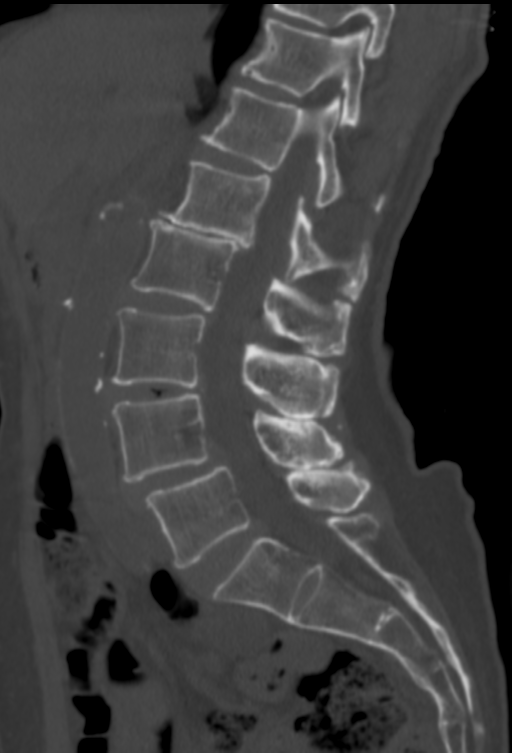

[10 of 33 positions shown; findings below may reference images not displayed]

FINDINGS: Coronal images through the lumbar spine exhibit mild dextroscoliotic curvature 
centered at L3 on sagittal images there is 7 mm of degenerative anterolisthesis 
at L4-5. There is 4 mm of degenerative retrolisthesis at L1-2 and 2 mm of 
degenerative retrolisthesis at L2-3. Alignment is otherwise unremarkable. None 
of the lumbar vertebral bodies, T11 and T12 exhibit loss of height. No 
suspicious lytic or blastic lesions are detected 
The T10-11 disc appears normal in height. No posterior disc bulging or central 
stenosis or facet degeneration is found. No significant foraminal narrowing. 
The T11-12 disc is normal in height. There is no posterior disc bulging or facet 
joint degeneration. No central canal stenosis or significant foraminal narrowing 
is present. 
The T12-L1 disc is relatively normal in height. No significant posterior disc 
bulging is present. There is no visible facet degeneration. No central or 
foraminal stenosis is found. 
At L1-2 there is severe degenerative disc disease as evidenced by complete loss 
of disc space height with mild endplate irregularity and sclerosis with small 
amounts of vacuum phenomenon. Mild degenerative retrolisthesis is again noted. 
Relatively mild degenerative changes are in the facet joints. There is no 
clinically significant central canal stenosis. There is a small LEFT-sided 
gas-filled extraforaminal disc herniation extending inferior to the level of the 
disc potentially affecting the exiting LEFT L1 nerve. Moderate bilateral 
foraminal narrowing is present without visible nerve impingement. 
At L2-3 the disc shows mild loss of height. Minimal posterior disc bulging is 
present. Mild degenerative changes are in the facet joints worse on the LEFT 
side especially superiorly. There is asymmetric thickening of the LEFT 
ligamentum flavum with at least mild LEFT lateral recess stenosis with only mild 
central canal stenosis. Right foramen is widely patent. There is moderate LEFT 
foraminal narrowing without visible impingement. 
At L3-4 the disc shows moderate loss of disc height with vacuum phenomenon. 
There is mild diffuse posterior disc bulging more prominently along the LEFT 
posterolateral aspect of the disc. Moderate to severe degenerative changes are 
in the facet joints. There is mild central canal stenosis. Mild to moderate LEFT 
lateral recess stenosis is present. There is severe LEFT foraminal narrowing 
potential intraforaminal nerve impingement. Right foramen exhibits mild 
narrowing without impingement. 
At L4-5 there is moderate loss of disc height. Degenerative anterolisthesis is 
again noted. Mild diffuse posterior annular bulge is present extending slightly 
above the level of the disc. Severe degenerative changes are in the facet 
joints. Mild to moderate central canal stenosis is present with at least 
moderate degrees of bilateral lateral recess stenosis. There is mild LEFT 
foraminal narrowing and moderate RIGHT foraminal narrowing. There is potentially 
some degree of impingement upon the RIGHT intraforaminal nerve. 
The L5-S1 disc is normal in height. Mild posterior disc bulging is present. 
Severe degenerative changes are in the facet joints. There is no significant 
central canal stenosis or lateral recess narrowing. Mild bilateral foraminal 
narrowing is present without impingement.
IMPRESSION: Degenerative changes as described in detail above for each level. Please 
correlate with clinical exam findings and history. No significant change is 
apparent when compared to the MRI examination obtained on 12/11/2019. The most 
significant central canal stenosis is at L4-5 where there is mild to moderate 
central canal stenosis. Potential foci of nerve impingement are present 
including a small LEFT-sided extraforaminal disc herniation at L1-2 potentially 
affecting the exiting LEFT L1 nerve and severe LEFT L3-4 foraminal narrowing 
with potential intraforaminal nerve impingement. Lateral recess stenosis is also 
present but is not as well evaluated on CT imaging in comparison to MR imaging. 
RADIATION DOSE REDUCTION: All CT scans are performed using radiation dose 
reduction techniques, when applicable.  Technical factors are evaluated and 
adjusted to ensure appropriate moderation of exposure.  Automated dose 
management technology is applied to adjust the radiation doses to minimize 
exposure while achieving diagnostic quality images.

## 2022-03-16 IMAGING — MR MRI ORBITS FACE OR NECK  W/WO CONTRAST
7 of 12 series · 28 of 48 positions shown · IV contrast (gadolinium)
Comparison: MRI neck from March 09, 2020. CT neck from August 22, 2016.

________________________________________________________________________________________________ 
MRI ORBITS FACE OR NECK  W/WO CONTRAST, 03/16/2022 [DATE]: 
CLINICAL INDICATION: Left neck mass.
TECHNIQUE: Multiplanar, multiecho position MR images of the neck were performed 
without and with intravenous gadolinium enhancement.  5 mL of Gadavist were 
injected intravenously by hand. 2.5 mL of Gadavist were discarded. Patient was 
scanned on a 1.5T magnet.

[Series 201: survey · axial · 10.0mm · 0.94mm/px · z∈[+38,+188]mm · 2 of 5 slices shown]
[im 1/5]
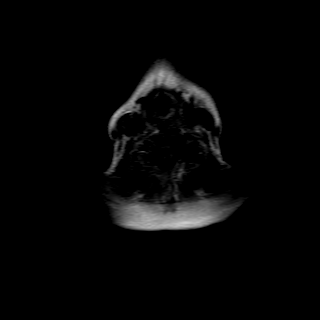
[im 5/5]
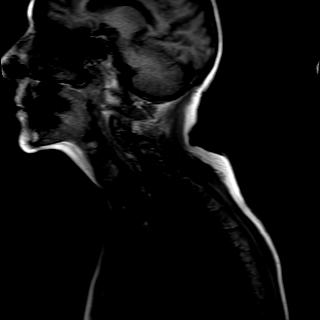

[Series 301: T2 · sagittal · 4.0mm · 0.38mm/px · 5 of 30 slices shown]
[im 1/30]
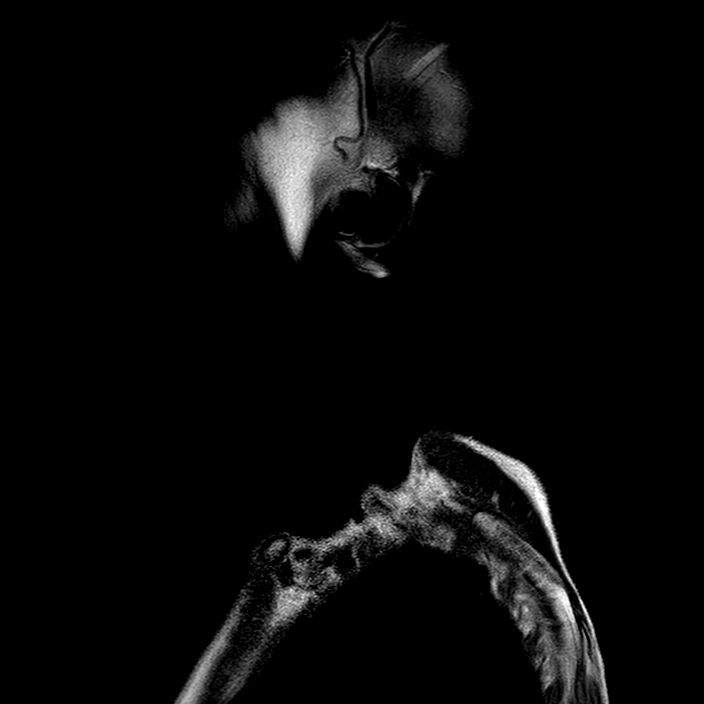
[im 8/30]
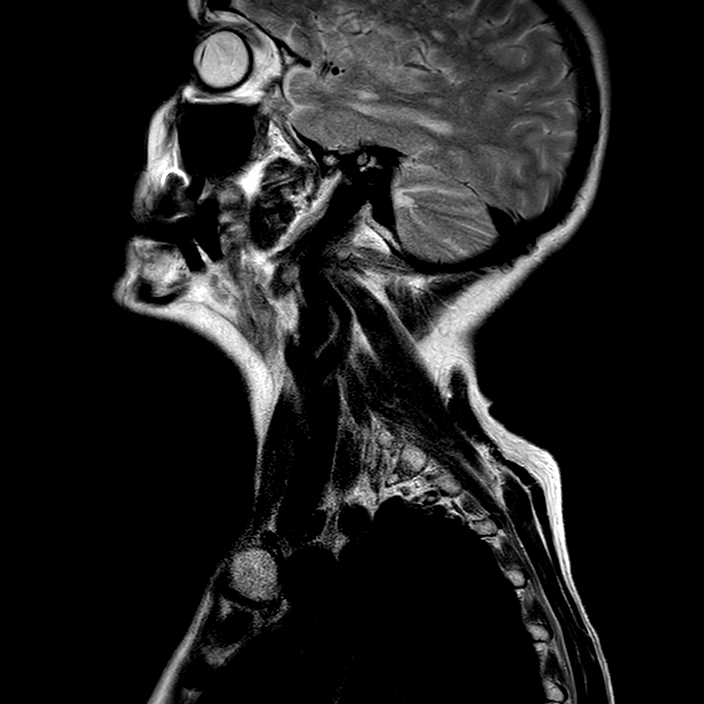
[im 15/30]
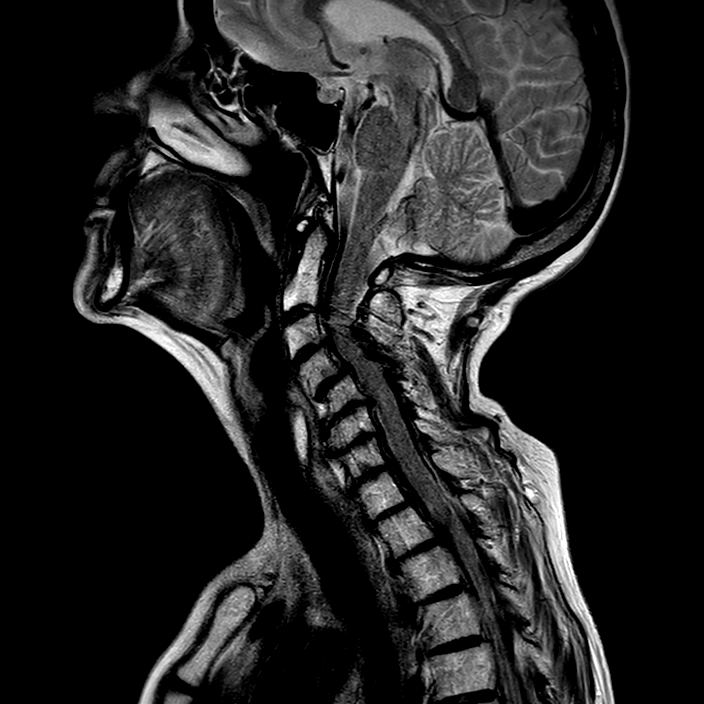
[im 22/30]
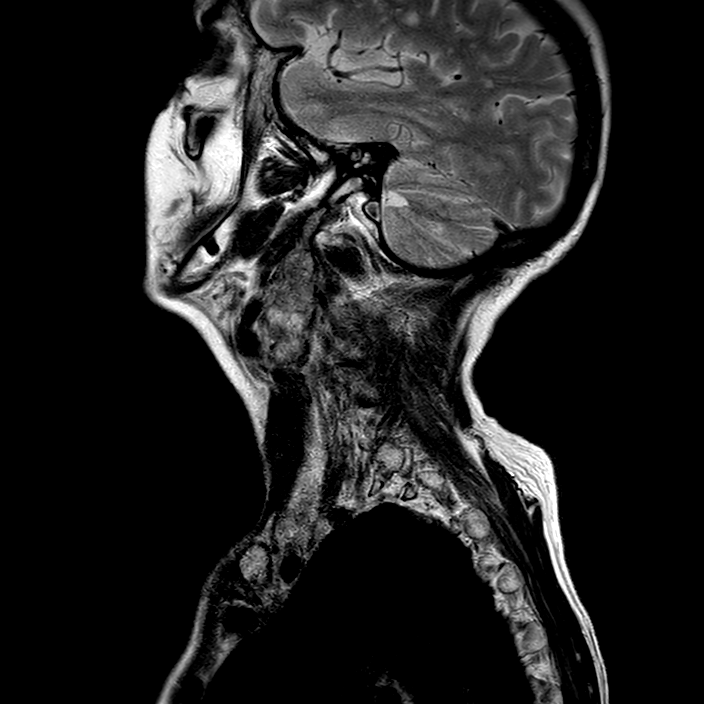
[im 30/30]
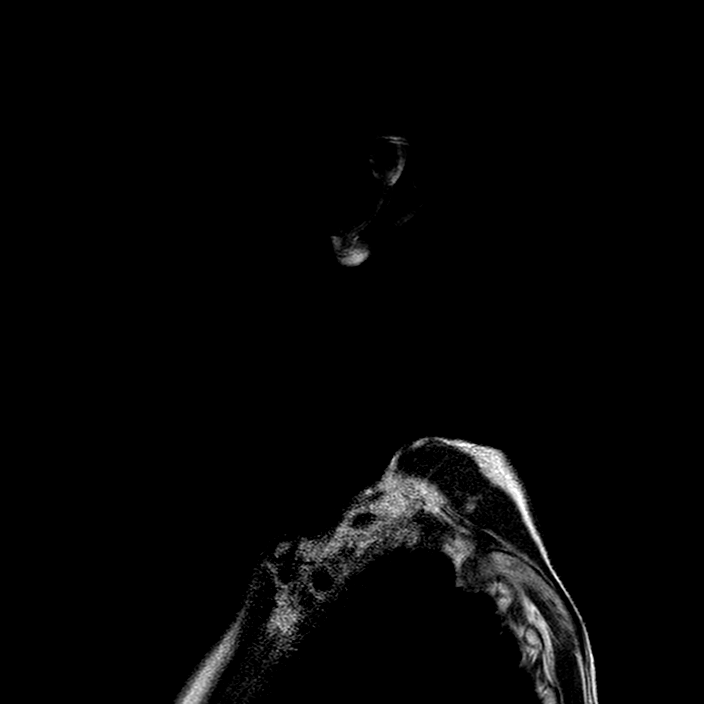

[Series 402: (id)_f/s dixon_parotid · axial · 3.5mm · 0.69mm/px · z∈[-13,+121]mm · 5 of 32 slices shown]
[im 1/32]
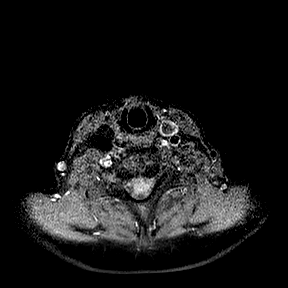
[im 8/32]
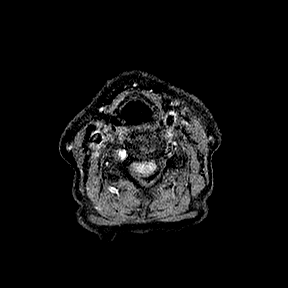
[im 16/32]
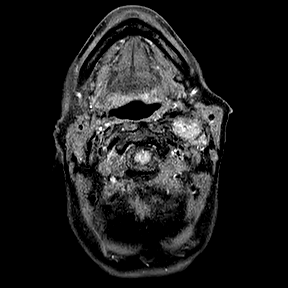
[im 24/32]
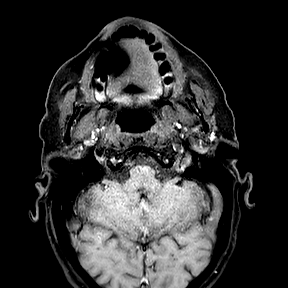
[im 32/32]
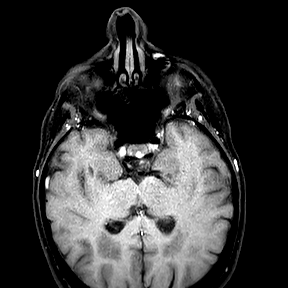

[Series 403: (id) dixon_parotid · axial · 3.5mm · 0.69mm/px · z∈[-13,+121]mm · 4 of 32 slices shown]
[im 1/32]
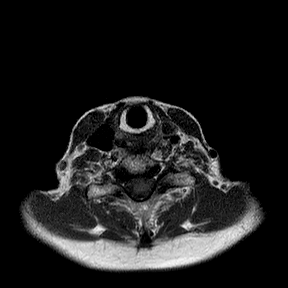
[im 11/32]
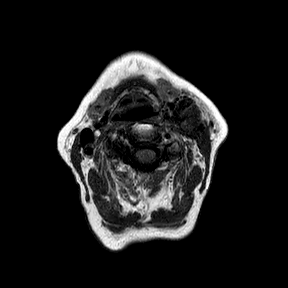
[im 21/32]
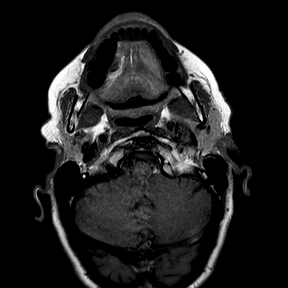
[im 32/32]
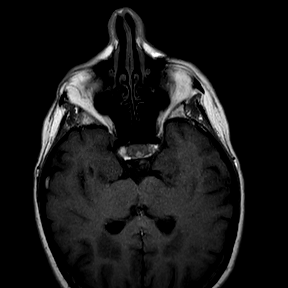

[Series 502: st2w fs mdixon_tse_rl · axial · 3.5mm · 0.60mm/px · z∈[-13,+121]mm · 4 of 32 slices shown]
[im 1/32]
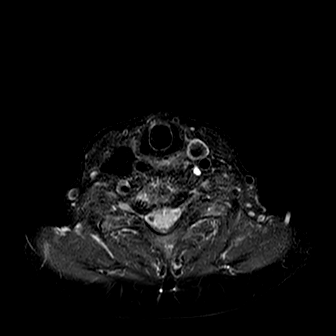
[im 11/32]
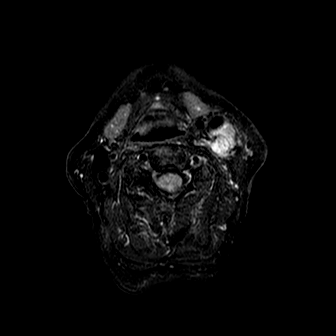
[im 21/32]
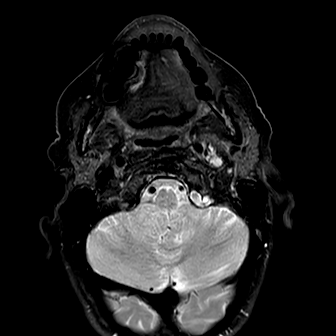
[im 32/32]
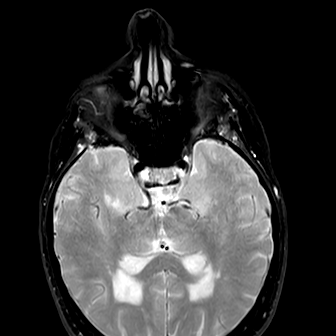

[Series 503: st2w mdixon_tse_rl · axial · 3.5mm · 0.60mm/px · z∈[-13,+121]mm · 4 of 32 slices shown]
[im 1/32]
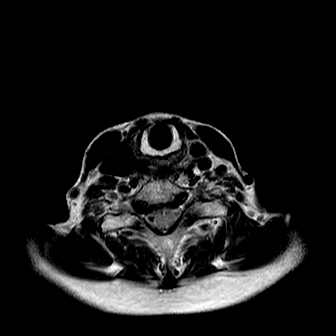
[im 11/32]
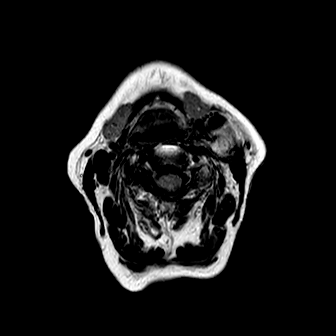
[im 21/32]
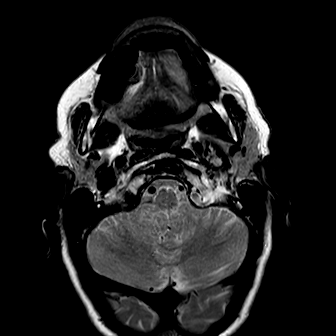
[im 32/32]
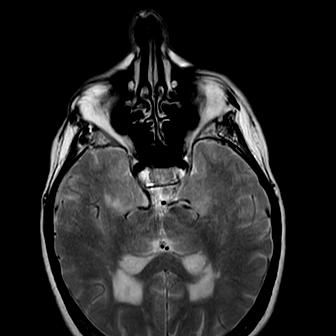

[Series 602: (id) fs dixon_tse_cor · coronal · 3.5mm · 0.62mm/px · 4 of 32 slices shown]
[im 1/32]
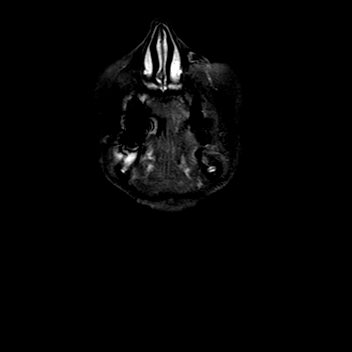
[im 11/32]
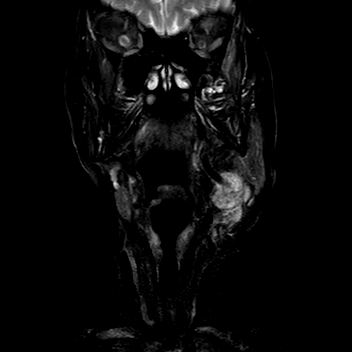
[im 21/32]
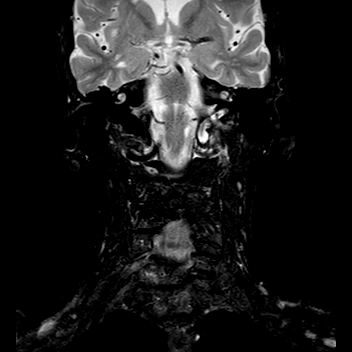
[im 32/32]
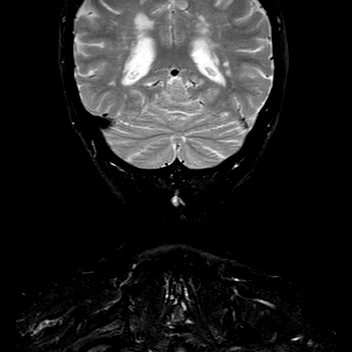

[28 of 48 positions shown; findings below may reference images not displayed]

FINDINGS: --------------------------------------------------------------------------- 
Neck: 
Stable left upper neck mass along the posterior margin of the left cervical 
internal carotid artery. This measures 2.5 x 2.5 x 5.8 cm (AP x TRV x CC). The 
lesion is T1 isointense, T2 hyperintense, enhancing. No change in this lesion 
since the 3390 CT. Findings are consistent with stable paraganglioma. 
Displacement and compression of the left internal jugular vein.  
Patency of the aerodigestive tract without evidence of exophytic lesion. The 
salivary glands are unremarkable. Thyroid gland is homogeneous. No pathologic 
cervical lymphadenopathy.  Patency of the vascular flow voids. Cervical spine 
shows no acute osseous abnormality noting degenerative changes. 
--------------------------------------------------------------------------- 
Visualized head: 
Periventricular and deep white matter change, probably secondary to 
microangiopathy. There is mild increased T1 signal within the left transverse 
sinus and sigmoid sinus with enhancement from slow flow. 
--------------------------------------------------------------------------- 
Visualized chest: 
Unremarkable. 
---------------------------------------------------------------------------
IMPRESSION: Stable left upper paraganglioma.
